# Patient Record
Sex: Female | Born: 1990 | Race: Black or African American | Hispanic: No | Marital: Married | Smoking: Never smoker
Health system: Southern US, Community
[De-identification: ages and names within clinical notes are randomized; demographics above are authoritative.]

## PROBLEM LIST (undated history)

## (undated) DIAGNOSIS — E282 Polycystic ovarian syndrome: Secondary | ICD-10-CM

## (undated) DIAGNOSIS — R519 Headache, unspecified: Secondary | ICD-10-CM

## (undated) HISTORY — PX: NO PAST SURGERIES: SHX2092

## (undated) HISTORY — DX: Polycystic ovarian syndrome: E28.2

---
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
## 2009-04-25 ENCOUNTER — Emergency Department (HOSPITAL_COMMUNITY): Admission: EM | Admit: 2009-04-25 | Discharge: 2009-04-25 | Payer: Self-pay | Admitting: Emergency Medicine

## 2011-01-07 ENCOUNTER — Ambulatory Visit: Payer: Self-pay | Admitting: Family Medicine

## 2011-04-18 DIAGNOSIS — E282 Polycystic ovarian syndrome: Secondary | ICD-10-CM

## 2011-04-18 HISTORY — DX: Polycystic ovarian syndrome: E28.2

## 2011-12-04 ENCOUNTER — Emergency Department (HOSPITAL_COMMUNITY)
Admission: EM | Admit: 2011-12-04 | Discharge: 2011-12-05 | Disposition: A | Discharge status: Home / Self Care | Payer: Self-pay | Attending: Emergency Medicine | Admitting: Emergency Medicine

## 2011-12-04 ENCOUNTER — Encounter (HOSPITAL_COMMUNITY): Payer: Self-pay | Admitting: *Deleted

## 2011-12-04 DIAGNOSIS — Z880 Allergy status to penicillin: Secondary | ICD-10-CM | POA: Insufficient documentation

## 2011-12-04 DIAGNOSIS — N939 Abnormal uterine and vaginal bleeding, unspecified: Secondary | ICD-10-CM | POA: Insufficient documentation

## 2011-12-04 DIAGNOSIS — N926 Irregular menstruation, unspecified: Secondary | ICD-10-CM | POA: Insufficient documentation

## 2011-12-04 LAB — URINE MICROSCOPIC-ADD ON

## 2011-12-04 LAB — URINALYSIS, ROUTINE W REFLEX MICROSCOPIC
Bilirubin Urine: NEGATIVE
Glucose, UA: NEGATIVE mg/dL
Ketones, ur: NEGATIVE mg/dL
Protein, ur: NEGATIVE mg/dL
Urobilinogen, UA: 1 mg/dL (ref 0.0–1.0)

## 2011-12-04 LAB — CBC WITH DIFFERENTIAL/PLATELET
Basophils Relative: 0 % (ref 0–1)
Eosinophils Absolute: 0.1 10*3/uL (ref 0.0–0.7)
Eosinophils Relative: 1 % (ref 0–5)
Hemoglobin: 10.5 g/dL — ABNORMAL LOW (ref 12.0–15.0)
Lymphs Abs: 3.9 10*3/uL (ref 0.7–4.0)
MCH: 27.8 pg (ref 26.0–34.0)
MCHC: 33.8 g/dL (ref 30.0–36.0)
MCV: 82.3 fL (ref 78.0–100.0)
Monocytes Absolute: 0.5 10*3/uL (ref 0.1–1.0)
Monocytes Relative: 6 % (ref 3–12)
RBC: 3.78 MIL/uL — ABNORMAL LOW (ref 3.87–5.11)

## 2011-12-04 LAB — PREGNANCY, URINE: Preg Test, Ur: NEGATIVE

## 2011-12-04 NOTE — ED Notes (Signed)
Pt has been having constant menstrual bleeding for past 4 wks; regular cycle two wks before that; passing large amt of clots and "white gray" material; feels weak as if "iron levels too low"; mild abd pain

## 2011-12-05 LAB — WET PREP, GENITAL: Yeast Wet Prep HPF POC: NONE SEEN

## 2011-12-05 NOTE — ED Provider Notes (Signed)
History     CSN: [id number]  Arrival date & time 12/04/11  2150   First MD Initiated Contact with Patient 12/05/11 0158      Chief Complaint  Patient presents with  . Vaginal Bleeding    (Consider location/radiation/quality/duration/timing/severity/associated sxs/prior treatment) HPI Comments: Patient comes in today with a chief complaint of vaginal bleeding.  She reports that she has had bleeding daily for the past 4 weeks.  Bleeding gradually improving.  She reports that she has been changing her tampon every hour, which she states has been saturated.  She states that she has never had this before.  She usually has regular 7 day menstrual cycles.  Her last normal menstrual period was 6 weeks ago.  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  She denies any fever or chills.  She denies dizziness or lightheadedness.  Denies shortness of breath.  No pelvic pain or abdominal pain.  No nausea or vomiting.  Denies vaginal discharge.  The history is provided by the patient.    History reviewed. No pertinent past medical history.  History reviewed. No pertinent past surgical history.  No family history on file.  History  Substance Use Topics  . Smoking status: Never Smoker   . Smokeless tobacco: Not on file  . Alcohol Use:     OB History    Grav Para Term Preterm Abortions TAB SAB Ect Mult Living                  Review of Systems  Constitutional: Negative for fever and chills.  HENT:       No epistaxis  Respiratory: Negative for shortness of breath.   Gastrointestinal: Negative for nausea, vomiting, abdominal pain and blood in stool.  Genitourinary: Positive for vaginal bleeding and menstrual problem. Negative for dysuria, frequency, vaginal discharge, pelvic pain and dyspareunia.  Neurological: Negative for dizziness, syncope and light-headedness.    Allergies  Penicillins  Home Medications   Current Outpatient Rx  Name Route Sig Dispense Refill  . BC HEADACHE PO Oral Take 1  packet by mouth daily as needed. Pain.      BP 115/67  Pulse 102  Temp 98 F (36.7 C) (Oral)  Resp 17  Wt 228 lb (103.42 kg)  SpO2 100%  LMP 12/04/2011  Physical Exam  Nursing note and vitals reviewed. Constitutional: She appears well-developed and well-nourished. No distress.  HENT:  Head: Normocephalic and atraumatic.  Cardiovascular: Normal rate, regular rhythm and normal heart sounds.   Pulmonary/Chest: Effort normal and breath sounds normal.  Abdominal: Soft. Bowel sounds are normal. She exhibits no distension and no mass. There is no tenderness. There is no rebound and no guarding.  Genitourinary: There is no rash, tenderness or lesion on the right labia. There is no rash, tenderness or lesion on the left labia. Cervix exhibits no motion tenderness. Right adnexum displays no mass, no tenderness and no fullness. Left adnexum displays no mass, no tenderness and no fullness. There is bleeding around the vagina. No tenderness around the vagina. No foreign body around the vagina.       Small amount of blood in the vaginal vault  Neurological: She is alert.  Skin: Skin is warm and dry. She is not diaphoretic.  Psychiatric: She has a normal mood and affect.    ED Course  Procedures (including critical care time)  Labs Reviewed  CBC WITH DIFFERENTIAL - Abnormal; Notable for the following:    RBC 3.78 (*)  Hemoglobin 10.5 (*)     HCT 31.1 (*)     All other components within normal limits  URINALYSIS, ROUTINE W REFLEX MICROSCOPIC - Abnormal; Notable for the following:    APPearance CLOUDY (*)     Hgb urine dipstick LARGE (*)     All other components within normal limits  URINE MICROSCOPIC-ADD ON - Abnormal; Notable for the following:    Bacteria, UA FEW (*)     All other components within normal limits  PREGNANCY, URINE   No results found.   No diagnosis found.    MDM  Patient with hemoglobin of 10.5.  VSS.   She is not having a large amount of vaginal bleeding at  this time.  Bleeding has been present for the past 4 weeks.  Therefore, feel that patient can be discharged home.  Patient given follow up with Gynecology.        Pascal Lux Lomita, PA-C 12/06/11 2220

## 2011-12-06 LAB — GC/CHLAMYDIA PROBE AMP, GENITAL
Chlamydia, DNA Probe: NEGATIVE
GC Probe Amp, Genital: NEGATIVE

## 2011-12-07 NOTE — ED Provider Notes (Signed)
Medical screening examination/treatment/procedure(s) were performed by non-physician practitioner and as supervising physician I was immediately available for consultation/collaboration.  Calder Oblinger M Larya Charpentier, MD 12/07/11 1622 

## 2012-02-16 ENCOUNTER — Emergency Department (HOSPITAL_COMMUNITY)
Admission: EM | Admit: 2012-02-16 | Discharge: 2012-02-16 | Disposition: A | Discharge status: Home / Self Care | Payer: Self-pay | Attending: Emergency Medicine | Admitting: Emergency Medicine

## 2012-02-16 ENCOUNTER — Encounter (HOSPITAL_COMMUNITY): Payer: Self-pay | Admitting: *Deleted

## 2012-02-16 DIAGNOSIS — Z79899 Other long term (current) drug therapy: Secondary | ICD-10-CM | POA: Insufficient documentation

## 2012-02-16 DIAGNOSIS — R197 Diarrhea, unspecified: Secondary | ICD-10-CM | POA: Insufficient documentation

## 2012-02-16 DIAGNOSIS — R1110 Vomiting, unspecified: Secondary | ICD-10-CM | POA: Insufficient documentation

## 2012-02-16 LAB — URINALYSIS, ROUTINE W REFLEX MICROSCOPIC
Glucose, UA: NEGATIVE mg/dL
Hgb urine dipstick: NEGATIVE
Ketones, ur: NEGATIVE mg/dL
Leukocytes, UA: NEGATIVE
pH: 8 (ref 5.0–8.0)

## 2012-02-16 LAB — CBC WITH DIFFERENTIAL/PLATELET
HCT: 36.5 % (ref 36.0–46.0)
Hemoglobin: 12.4 g/dL (ref 12.0–15.0)
Lymphs Abs: 2.7 10*3/uL (ref 0.7–4.0)
MCH: 26.9 pg (ref 26.0–34.0)
Monocytes Relative: 7 % (ref 3–12)
Neutro Abs: 3.9 10*3/uL (ref 1.7–7.7)
Neutrophils Relative %: 54 % (ref 43–77)
RBC: 4.61 MIL/uL (ref 3.87–5.11)

## 2012-02-16 LAB — COMPREHENSIVE METABOLIC PANEL
Alkaline Phosphatase: 35 U/L — ABNORMAL LOW (ref 39–117)
BUN: 7 mg/dL (ref 6–23)
Chloride: 105 mEq/L (ref 96–112)
GFR calc Af Amer: >90 mL/min (ref >90)
GFR calc non Af Amer: >90 mL/min (ref >90)
Glucose, Bld: 92 mg/dL (ref 70–99)
Potassium: 3.7 mEq/L (ref 3.5–5.1)
Total Bilirubin: 0.2 mg/dL — ABNORMAL LOW (ref 0.3–1.2)

## 2012-02-16 LAB — PREGNANCY, URINE: Preg Test, Ur: NEGATIVE

## 2012-02-16 MED ORDER — METOCLOPRAMIDE HCL 10 MG PO TABS: 10.0000 mg | ORAL_TABLET | Freq: Four times a day (QID) | ORAL | Status: DC | PRN

## 2012-02-16 MED ORDER — ONDANSETRON 8 MG PO TBDP: ORAL_TABLET | ORAL | Status: DC

## 2012-02-16 NOTE — ED Provider Notes (Signed)
History     CSN: [id number]  Arrival date & time 02/16/12  1708   First MD Initiated Contact with Patient 02/16/12 1943      Chief Complaint  Patient presents with  . Emesis    (Consider location/radiation/quality/duration/timing/severity/associated sxs/prior treatment) HPI This healthy 21 year old female had sudden onset of nonbloody vomiting and diarrhea multiple times with intermittent diffuse abdominal cramps this afternoon lasting several hours. She now is no pain anymore and has some minimal nausea. She feels much better now. She is no chest pain cough or shortness breath. She is no fever or body aches rash or altered mental status. There's no trauma. She is no dysuria or vaginal bleeding or vaginal discharge. Her abdominal pain was not localized. There is no treatment prior to arrival History reviewed. No pertinent past medical history.  History reviewed. No pertinent past surgical history.  No family history on file.  History  Substance Use Topics  . Smoking status: Never Smoker   . Smokeless tobacco: Not on file  . Alcohol Use: Yes    OB History    Grav Para Term Preterm Abortions TAB SAB Ect Mult Living                  Review of Systems 10 Systems reviewed and are negative for acute change except as noted in the HPI. Allergies  Penicillins  Home Medications   Current Outpatient Rx  Name  Route  Sig  Dispense  Refill  . BC HEADACHE PO   Oral   Take 1 packet by mouth daily as needed. Pain.         Marland Kitchen METOCLOPRAMIDE HCL 10 MG PO TABS   Oral   Take 1 tablet (10 mg total) by mouth every 6 (six) hours as needed (nausea/headache).   4 tablet   0   . ONDANSETRON 8 MG PO TBDP      8mg  ODT q4 hours prn nausea   3 tablet   0     BP 129/82  Pulse 82  Temp 98.5 F (36.9 C) (Oral)  Resp 20  SpO2 100%  LMP 12/17/2011  Physical Exam  Nursing note and vitals reviewed. Constitutional:       Awake, alert, nontoxic appearance.  HENT:  Head:  Atraumatic.  Eyes: Right eye exhibits no discharge. Left eye exhibits no discharge.  Neck: Neck supple.  Cardiovascular: Normal rate and regular rhythm.   No murmur heard. Pulmonary/Chest: Effort normal and breath sounds normal. No respiratory distress. She has no wheezes. She has no rales. She exhibits no tenderness.  Abdominal: Soft. Bowel sounds are normal. She exhibits no distension and no mass. There is no tenderness. There is no rebound and no guarding.  Musculoskeletal: She exhibits no tenderness.       Baseline ROM, no obvious new focal weakness.  Neurological: She is alert.       Mental status and motor strength appears baseline for patient and situation.  Skin: No rash noted.  Psychiatric: She has a normal mood and affect.    ED Course  Procedures (including critical care time)  Labs Reviewed  COMPREHENSIVE METABOLIC PANEL - Abnormal; Notable for the following:    Alkaline Phosphatase 35 (*)     Total Bilirubin 0.2 (*)     All other components within normal limits  URINALYSIS, ROUTINE W REFLEX MICROSCOPIC  PREGNANCY, URINE  CBC WITH DIFFERENTIAL  LAB REPORT - SCANNED   No results found.   1. Vomiting and diarrhea  MDM  Pt stable in ED with no significant deterioration in condition.  Patient / Family / Caregiver informed of clinical course, understand medical decision-making process, and agree with plan.  I doubt any other EMC precluding discharge at this time including, but not necessarily limited to the following:SBI.         Hurman Horn, MD 02/18/12 [phone]

## 2012-02-16 NOTE — ED Notes (Signed)
The pt ate a sandwich at 1200n today and 15-20 minutes after that she started having Kuna and diarrhea with abd cramps

## 2012-11-09 ENCOUNTER — Emergency Department: Payer: Self-pay | Admitting: Emergency Medicine

## 2012-11-09 LAB — CBC
HGB: 9.6 g/dL — ABNORMAL LOW (ref 12.0–16.0)
MCH: 21.7 pg — ABNORMAL LOW (ref 26.0–34.0)
MCHC: 30.8 g/dL — ABNORMAL LOW (ref 32.0–36.0)
RBC: 4.44 10*6/uL (ref 3.80–5.20)
WBC: 4.9 10*3/uL (ref 3.6–11.0)

## 2012-11-09 LAB — URINALYSIS, COMPLETE
Glucose,UR: NEGATIVE mg/dL (ref 0–75)
Leukocyte Esterase: NEGATIVE
Nitrite: NEGATIVE
Specific Gravity: 1.028 (ref 1.003–1.030)
WBC UR: 1 /HPF (ref 0–5)

## 2012-11-09 LAB — GC/CHLAMYDIA PROBE AMP

## 2012-11-09 LAB — COMPREHENSIVE METABOLIC PANEL
Albumin: 3.9 g/dL (ref 3.4–5.0)
BUN: 7 mg/dL (ref 7–18)
Calcium, Total: 9.4 mg/dL (ref 8.5–10.1)
Co2: 24 mmol/L (ref 21–32)
EGFR (Non-African Amer.): >60
Glucose: 86 mg/dL (ref 65–99)
Potassium: 3.4 mmol/L — ABNORMAL LOW (ref 3.5–5.1)
SGPT (ALT): 42 U/L (ref 12–78)

## 2014-11-03 ENCOUNTER — Ambulatory Visit
Admission: RE | Admit: 2014-11-03 | Discharge: 2014-11-03 | Disposition: A | Discharge status: Home / Self Care | Payer: PRIVATE HEALTH INSURANCE | Source: Ambulatory Visit | Attending: Infectious Diseases | Admitting: Infectious Diseases

## 2014-11-03 ENCOUNTER — Other Ambulatory Visit: Payer: Self-pay | Admitting: Infectious Diseases

## 2014-11-03 DIAGNOSIS — R7612 Nonspecific reaction to cell mediated immunity measurement of gamma interferon antigen response without active tuberculosis: Secondary | ICD-10-CM

## 2017-01-31 ENCOUNTER — Emergency Department
Admission: EM | Admit: 2017-01-31 | Discharge: 2017-01-31 | Disposition: A | Discharge status: Home / Self Care | Payer: Self-pay | Attending: Emergency Medicine | Admitting: Emergency Medicine

## 2017-01-31 ENCOUNTER — Emergency Department: Payer: Self-pay

## 2017-01-31 ENCOUNTER — Encounter: Payer: Self-pay | Admitting: Emergency Medicine

## 2017-01-31 DIAGNOSIS — R200 Anesthesia of skin: Secondary | ICD-10-CM | POA: Insufficient documentation

## 2017-01-31 DIAGNOSIS — R51 Headache: Secondary | ICD-10-CM | POA: Insufficient documentation

## 2017-01-31 LAB — DIFFERENTIAL
BASOS PCT: 1 %
Basophils Absolute: 0.1 10*3/uL (ref 0–0.1)
EOS PCT: 1 %
Eosinophils Absolute: 0.1 10*3/uL (ref 0–0.7)
LYMPHS PCT: 30 %
Lymphs Abs: 2.3 10*3/uL (ref 1.0–3.6)
MONO ABS: 0.5 10*3/uL (ref 0.2–0.9)
MONOS PCT: 7 %
NEUTROS ABS: 4.8 10*3/uL (ref 1.4–6.5)
Neutrophils Relative %: 61 %

## 2017-01-31 LAB — CBC
HEMATOCRIT: 41 % (ref 35.0–47.0)
Hemoglobin: 13.4 g/dL (ref 12.0–16.0)
MCH: 27.3 pg (ref 26.0–34.0)
MCHC: 32.8 g/dL (ref 32.0–36.0)
MCV: 83.3 fL (ref 80.0–100.0)
Platelets: 377 10*3/uL (ref 150–440)
RBC: 4.92 MIL/uL (ref 3.80–5.20)
RDW: 14 % (ref 11.5–14.5)
WBC: 7.8 10*3/uL (ref 3.6–11.0)

## 2017-01-31 LAB — COMPREHENSIVE METABOLIC PANEL
ALK PHOS: 31 U/L — AB (ref 38–126)
ALT: 16 U/L (ref 14–54)
ANION GAP: 9 (ref 5–15)
AST: 16 U/L (ref 15–41)
Albumin: 4.3 g/dL (ref 3.5–5.0)
BUN: 11 mg/dL (ref 6–20)
CALCIUM: 9.4 mg/dL (ref 8.9–10.3)
CO2: 24 mmol/L (ref 22–32)
CREATININE: 0.84 mg/dL (ref 0.44–1.00)
Chloride: 107 mmol/L (ref 101–111)
Glucose, Bld: 86 mg/dL (ref 65–99)
Potassium: 3.6 mmol/L (ref 3.5–5.1)
Sodium: 140 mmol/L (ref 135–145)
Total Bilirubin: 0.5 mg/dL (ref 0.3–1.2)
Total Protein: 8.1 g/dL (ref 6.5–8.1)

## 2017-01-31 LAB — TROPONIN I

## 2017-01-31 LAB — POCT PREGNANCY, URINE: PREG TEST UR: NEGATIVE

## 2017-01-31 MED ORDER — ASPIRIN 81 MG PO CHEW
CHEWABLE_TABLET | ORAL | Status: AC
  Administered 2017-01-31: 324 mg via ORAL
  Filled 2017-01-31: qty 4

## 2017-01-31 MED ORDER — SUMATRIPTAN SUCCINATE 50 MG PO TABS: 50.0000 mg | ORAL_TABLET | Freq: Once | ORAL | 0 refills | Status: DC | PRN

## 2017-01-31 MED ORDER — ASPIRIN 81 MG PO CHEW
324.0000 mg | CHEWABLE_TABLET | Freq: Once | ORAL | Status: AC
  Administered 2017-01-31: 324 mg via ORAL

## 2017-01-31 NOTE — ED Provider Notes (Signed)
[facility] Emergency Department Provider Note  ____________________________________________   First MD Initiated Contact with Patient 01/31/17 [phone]     (approximate)  I have reviewed the triage vital signs and the nursing notes.   HISTORY  Chief Complaint Headache and Numbness    HPI Victoria Kerr is a 26 y.o. female who self presents to the emergency department with gradual onset not maximal onset left sided throbbing headache similar to previous headaches as well as numbness in her left lateral arm and left pinky finger for roughly 24 hours. Her symptoms became more intense while at work today so she initially called 911 however declined transport when EMS arrived. She's had no fevers or chills. No neck pain. No photophobia. No nausea or vomiting. She also does note some numbness in her left lateral foot. No weakness. She has not fallen. Nothing in particular seems to make her symptoms better or worse. Her headache is nonradiating.   History reviewed. No pertinent past medical history.  There are no active problems to display for this patient.   History reviewed. No pertinent surgical history.  Prior to Admission medications   Medication Sig Start Date End Date Taking? Authorizing Provider  Aspirin-Salicylamide-Caffeine (BC HEADACHE PO) Take 1 packet by mouth daily as needed. Pain.    [provider]  metoCLOPramide (REGLAN) 10 MG tablet Take 1 tablet (10 mg total) by mouth every 6 (six) hours as needed (nausea/headache). 02/16/12   Wayland SalinasBednar, John, MD  ondansetron (ZOFRAN ODT) 8 MG disintegrating tablet 8mg  ODT q4 hours prn nausea 02/16/12   Wayland SalinasBednar, John, MD  SUMAtriptan (IMITREX) 50 MG tablet Take 1 tablet (50 mg total) by mouth once as needed for migraine. May repeat in 2 hours if headache persists or recurs. 01/31/17 02/01/18  Merrily Brittleifenbark, Travell Desaulniers, MD    Allergies Penicillins  No family history on file.  Social History Social  History  Substance Use Topics  . Smoking status: Never Smoker  . Smokeless tobacco: Never Used  . Alcohol use Yes    Review of Systems Constitutional: No fever/chills Eyes: No visual changes. ENT: No sore throat. Cardiovascular: Denies chest pain. Respiratory: Denies shortness of breath. Gastrointestinal: No abdominal pain.  No nausea, no vomiting.  No diarrhea.  No constipation. Genitourinary: Negative for dysuria. Musculoskeletal: Negative for back pain. Skin: Negative for rash. Neurological: positive for headache   ____________________________________________   PHYSICAL EXAM:  VITAL SIGNS: ED Triage Vitals  Enc Vitals Group     BP 01/31/17 1439 129/83     Pulse Rate 01/31/17 1439 100     Resp 01/31/17 1439 20     Temp 01/31/17 1439 98.4 F (36.9 C)     Temp Source 01/31/17 1439 Oral     SpO2 01/31/17 1439 99 %     Weight 01/31/17 1441 228 lb (103.4 kg)     Height --      Head Circumference --      Peak Flow --      Pain Score --      Pain Loc --      Pain Edu? --      Excl. in GC? --     Constitutional: alert and oriented 4 well appearing nontoxic no diaphoresis speaks full clear sentences Eyes: PERRL EOMI. Head: Atraumatic. Nose: No congestion/rhinnorhea. Mouth/Throat: No trismus Neck: No stridor.  no meningismus Cardiovascular: Normal rate, regular rhythm. Grossly normal heart sounds.  Good peripheral circulation. Respiratory: Normal respiratory effort.  No retractions. Lungs CTAB  and moving good air Gastrointestinal: soft nontender Musculoskeletal: No lower extremity edema   Neurologic:  Alert and oriented 4 Cranial nerves II through XII intact No pronator drift 5 out of 5 grips, biceps, triceps, hip flexion, hip extension plantar flexion, dorsiflexion subjective decreased sensation to left lateral arm and left foot 2+ DTRs and no ankle clonus Normal finger-nose-finger Ambulates with steady gait  Skin:  Skin is warm, dry and intact. No rash  noted. Psychiatric: Mood and affect are normal. Speech and behavior are normal.    ____________________________________________   DIFFERENTIAL includes but not limited to  stroke, TIA, complex migraine, dissection ____________________________________________   LABS (all labs ordered are listed, but only abnormal results are displayed)  Labs Reviewed  COMPREHENSIVE METABOLIC PANEL - Abnormal; Notable for the following:       Result Value   Alkaline Phosphatase 31 (*)    All other components within normal limits  CBC  DIFFERENTIAL  TROPONIN I  POCT PREGNANCY, URINE    blood work reviewed and interpreted by me as no acute disease __________________________________________  EKG  ED ECG REPORT I, Merrily Brittle, the attending physician, personally viewed and interpreted this ECG.  Date: 01/31/2017 EKG Time:  Rate: 90 Rhythm: normal sinus rhythm QRS Axis: normal Intervals: normal ST/T Wave abnormalities: normal Narrative Interpretation: no evidence of acute ischemia  ____________________________________________  RADIOLOGY  head CT reviewed by me shows no acute disease ____________________________________________   PROCEDURES  Procedure(s) performed: no  Procedures  Critical Care performed: no  Observation: no ____________________________________________   INITIAL IMPRESSION / ASSESSMENT AND PLAN / ED COURSE  Pertinent labs & imaging results that were available during my care of the patient were reviewed by me and considered in my medical decision making (see chart for details).  The patient arrives very well-appearing and neuro intact aside from numbness in her left lateral arm and some of her left foot. This very well could be radicular symptoms from her neck, however that would not explain her foot. She had a head CT in triage which is negative for acute disease and as her symptoms are greater than 24 hours out stroke should be positive at this point.  Distal possible that the patient has had a small stroke or transient ischemic attacks I've advised her to begin taking aspirin daily and to follow up with her primary care physician for possible TIA workup. She verbalizes understanding and agreement with the plan. She is discharged home in improved condition. Precautions given.      ____________________________________________   FINAL CLINICAL IMPRESSION(S) / ED DIAGNOSES  Final diagnoses:  Numbness      NEW MEDICATIONS STARTED DURING THIS VISIT:  Discharge Medication List as of 01/31/2017  4:21 PM    START taking these medications   Details  SUMAtriptan (IMITREX) 50 MG tablet Take 1 tablet (50 mg total) by mouth once as needed for migraine. May repeat in 2 hours if headache persists or recurs., Starting Wed 01/31/2017, Until Fri 02/01/2018, Print         Note:  This document was prepared using Dragon voice recognition software and may include unintentional dictation errors.     Merrily Brittle, MD 02/01/17 1445

## 2017-01-31 NOTE — Discharge Instructions (Signed)
Please begin taking 81 mg of aspirin every day and use your new migraine medication as needed for severe symptoms. Please make an appointment to establish care with a primary care physician within the next week for recheck. Return to the emergency department sooner for any new or worsening symptoms such as worsening numbness, weakness, seizure, or for any other concerns whatsoever.  It was a pleasure to take care of you today, and thank you for coming to our emergency department.  If you have any questions or concerns before leaving please ask the nurse to grab me and I'm more than happy to go through your aftercare instructions again.  If you were prescribed any opioid pain medication today such as Norco, Vicodin, Percocet, morphine, hydrocodone, or oxycodone please make sure you do not drive when you are taking this medication as it can alter your ability to drive safely.  If you have any concerns once you are home that you are not improving or are in fact getting worse before you can make it to your follow-up appointment, please do not hesitate to call 911 and come back for further evaluation.  Merrily BrittleNeil Ammiel Guiney, MD  Results for orders placed or performed during the hospital encounter of 01/31/17  CBC  Result Value Ref Range   WBC 7.8 3.6 - 11.0 K/uL   RBC 4.92 3.80 - 5.20 MIL/uL   Hemoglobin 13.4 12.0 - 16.0 g/dL   HCT 16.141.0 09.635.0 - 04.547.0 %   MCV 83.3 80.0 - 100.0 fL   MCH 27.3 26.0 - 34.0 pg   MCHC 32.8 32.0 - 36.0 g/dL   RDW 40.914.0 81.111.5 - 91.414.5 %   Platelets 377 150 - 440 K/uL  Differential  Result Value Ref Range   Neutrophils Relative % 61 %   Neutro Abs 4.8 1.4 - 6.5 K/uL   Lymphocytes Relative 30 %   Lymphs Abs 2.3 1.0 - 3.6 K/uL   Monocytes Relative 7 %   Monocytes Absolute 0.5 0.2 - 0.9 K/uL   Eosinophils Relative 1 %   Eosinophils Absolute 0.1 0 - 0.7 K/uL   Basophils Relative 1 %   Basophils Absolute 0.1 0 - 0.1 K/uL  Comprehensive metabolic panel  Result Value Ref Range   Sodium 140 135 - 145 mmol/L   Potassium 3.6 3.5 - 5.1 mmol/L   Chloride 107 101 - 111 mmol/L   CO2 24 22 - 32 mmol/L   Glucose, Bld 86 65 - 99 mg/dL   BUN 11 6 - 20 mg/dL   Creatinine, Ser 7.820.84 0.44 - 1.00 mg/dL   Calcium 9.4 8.9 - 95.610.3 mg/dL   Total Protein 8.1 6.5 - 8.1 g/dL   Albumin 4.3 3.5 - 5.0 g/dL   AST 16 15 - 41 U/L   ALT 16 14 - 54 U/L   Alkaline Phosphatase 31 (L) 38 - 126 U/L   Total Bilirubin 0.5 0.3 - 1.2 mg/dL   GFR calc non Af Amer >60 >60 mL/min   GFR calc Af Amer >60 >60 mL/min   Anion gap 9 5 - 15  Troponin I  Result Value Ref Range   Troponin I <0.03 <0.03 ng/mL  Pregnancy, urine POC  Result Value Ref Range   Preg Test, Ur NEGATIVE NEGATIVE   Ct Head Wo Contrast  Result Date: 01/31/2017 CLINICAL DATA:  Chronic headache. Left-sided arm and pinky finger numbness for 2 days with headache. EXAM: CT HEAD WITHOUT CONTRAST TECHNIQUE: Contiguous axial images were obtained from the base of  the skull through the vertex without intravenous contrast. COMPARISON:  None. FINDINGS: Brain: No evidence of acute infarction, hemorrhage, hydrocephalus, extra-axial collection or mass lesion/mass effect. Vascular: No hyperdense vessel or unexpected calcification. Skull: Normal. Negative for fracture or focal lesion. Sinuses/Orbits: No acute finding. IMPRESSION: Negative head CT. Electronically Signed   By: Marnee Spring M.D.   On: 01/31/2017 15:50

## 2017-01-31 NOTE — ED Triage Notes (Signed)
Pt c/o left sided arm and pinky finger numbness started two days with a headache, all resolved and the returned today. Pt with hx of migraines. Pt states her job called ems out today and they thought she may be having an anxiety attack. Pt denies any anxiety.

## 2017-01-31 NOTE — ED Notes (Signed)
Pt verbalizes d/c teaching and rx. Pt in NAD at time of d/c, pt ambulatory with spouse. Pt signed paper form of e-signature due to comput malfnx

## 2017-02-01 DIAGNOSIS — R7303 Prediabetes: Secondary | ICD-10-CM | POA: Insufficient documentation

## 2018-03-10 ENCOUNTER — Emergency Department: Payer: BLUE CROSS/BLUE SHIELD

## 2018-03-10 ENCOUNTER — Other Ambulatory Visit: Payer: Self-pay

## 2018-03-10 ENCOUNTER — Emergency Department
Admission: EM | Admit: 2018-03-10 | Discharge: 2018-03-10 | Disposition: A | Discharge status: Home / Self Care | Payer: BLUE CROSS/BLUE SHIELD | Attending: Emergency Medicine | Admitting: Emergency Medicine

## 2018-03-10 ENCOUNTER — Encounter: Payer: Self-pay | Admitting: Emergency Medicine

## 2018-03-10 DIAGNOSIS — K5900 Constipation, unspecified: Secondary | ICD-10-CM

## 2018-03-10 DIAGNOSIS — R1032 Left lower quadrant pain: Secondary | ICD-10-CM | POA: Diagnosis not present

## 2018-03-10 DIAGNOSIS — O9989 Other specified diseases and conditions complicating pregnancy, childbirth and the puerperium: Secondary | ICD-10-CM | POA: Insufficient documentation

## 2018-03-10 DIAGNOSIS — Z3A01 Less than 8 weeks gestation of pregnancy: Secondary | ICD-10-CM | POA: Diagnosis not present

## 2018-03-10 DIAGNOSIS — R109 Unspecified abdominal pain: Secondary | ICD-10-CM

## 2018-03-10 DIAGNOSIS — O99611 Diseases of the digestive system complicating pregnancy, first trimester: Secondary | ICD-10-CM | POA: Insufficient documentation

## 2018-03-10 DIAGNOSIS — O26891 Other specified pregnancy related conditions, first trimester: Secondary | ICD-10-CM

## 2018-03-10 LAB — CBC
HCT: 39.9 % (ref 36.0–46.0)
HEMOGLOBIN: 13.4 g/dL (ref 12.0–15.0)
MCH: 27.3 pg (ref 26.0–34.0)
MCHC: 33.6 g/dL (ref 30.0–36.0)
MCV: 81.3 fL (ref 80.0–100.0)
PLATELETS: 427 10*3/uL — AB (ref 150–400)
RBC: 4.91 MIL/uL (ref 3.87–5.11)
RDW: 13.3 % (ref 11.5–15.5)
WBC: 7.7 10*3/uL (ref 4.0–10.5)
nRBC: 0 % (ref 0.0–0.2)

## 2018-03-10 LAB — URINALYSIS, COMPLETE (UACMP) WITH MICROSCOPIC
Bacteria, UA: NONE SEEN
Bilirubin Urine: NEGATIVE
GLUCOSE, UA: NEGATIVE mg/dL
Ketones, ur: NEGATIVE mg/dL
Leukocytes, UA: NEGATIVE
NITRITE: NEGATIVE
PH: 6 (ref 5.0–8.0)
Protein, ur: NEGATIVE mg/dL
Specific Gravity, Urine: 1.011 (ref 1.005–1.030)
WBC UA: NONE SEEN WBC/hpf (ref 0–5)

## 2018-03-10 LAB — COMPREHENSIVE METABOLIC PANEL
ALK PHOS: 24 U/L — AB (ref 38–126)
ALT: 28 U/L (ref 0–44)
ANION GAP: 9 (ref 5–15)
AST: 18 U/L (ref 15–41)
Albumin: 4 g/dL (ref 3.5–5.0)
BUN: 6 mg/dL (ref 6–20)
CALCIUM: 9.5 mg/dL (ref 8.9–10.3)
CO2: 24 mmol/L (ref 22–32)
Chloride: 104 mmol/L (ref 98–111)
Creatinine, Ser: 0.65 mg/dL (ref 0.44–1.00)
GFR calc Af Amer: >60 mL/min (ref >60)
Glucose, Bld: 85 mg/dL (ref 70–99)
POTASSIUM: 4.1 mmol/L (ref 3.5–5.1)
SODIUM: 137 mmol/L (ref 135–145)
Total Bilirubin: 0.6 mg/dL (ref 0.3–1.2)
Total Protein: 7.4 g/dL (ref 6.5–8.1)

## 2018-03-10 LAB — LIPASE, BLOOD: LIPASE: 32 U/L (ref 11–51)

## 2018-03-10 LAB — HCG, QUANTITATIVE, PREGNANCY: HCG, BETA CHAIN, QUANT, S: 85833 m[IU]/mL — AB (ref <5)

## 2018-03-10 MED ORDER — DOCUSATE SODIUM 100 MG PO CAPS: 100.0000 mg | ORAL_CAPSULE | Freq: Every day | ORAL | 0 refills | Status: DC

## 2018-03-10 MED ORDER — SENNA 8.6 MG PO TABS: 1.0000 | ORAL_TABLET | Freq: Every day | ORAL | 0 refills | Status: DC

## 2018-03-10 NOTE — ED Notes (Signed)
Pt verbalized understanding of discharge instructions. NAD at this time. 

## 2018-03-10 NOTE — Discharge Instructions (Addendum)
Constipation: Take colace twice a day everyday. Take senna once a day at bedtime. Take daily probiotics. Drink plenty of fluids and eat a diet rich in fiber. If you go more than 3 days without a bowel movement, take 1 cap full of Miralax in the morning and one in the evening up to 5 days.  ° °

## 2018-03-10 NOTE — ED Notes (Addendum)
Patient transported to Ultrasound. Pt ambulatory and in NAD. Pt has been updated by this RN as best as possible to this point and verbalized understanding.

## 2018-03-10 NOTE — ED Triage Notes (Signed)
Pt in via POV with complaints of lower abdominal pain x approximately 3 weeks, states she is [redacted] weeks pregnant, unable to see OB until December.  Pt denies any abnormal bleeding, reports small amount brown discharge.  Reports nausea, denies vomiting/diarrhea.  Ambulatory to triage, NAD noted at this time.

## 2018-03-10 NOTE — ED Provider Notes (Signed)
[facility] Emergency Department Provider Note  ____________________________________________  Time seen: Approximately 4:30 PM  I have reviewed the triage vital signs and the nursing notes.   HISTORY  Chief Complaint Abdominal Pain   HPI Victoria Kerr is a 27 y.o. female no significant past medical history who presents for evaluation of left lower quadrant abdominal pain.  Patient reports the pain has been intermittent for 3 weeks.  The pain is sharp and usually associated with constipation.  She will take over-the-counter medications and have a bowel movement and the pain will resolve for several days.  Last week she took a pregnancy test and she was surprised to find that he was positive.  She then became concerned that the abdominal pain could be due to complication from the pregnancy.  This is patient's first pregnancy.  She denies any prior abdominal surgeries, fever or chills, vaginal discharge or vaginal bleeding, dysuria or hematuria, diarrhea, vomiting, or nausea.  She has an appointment with OB/GYN in 2 weeks but has not establish care for this pregnancy yet.  LMP was 01/19/2018.  PMH None - reviewed  Prior to Admission medications   Medication Sig Start Date End Date Taking? Authorizing Provider  Aspirin-Salicylamide-Caffeine (BC HEADACHE PO) Take 1 packet by mouth daily as needed. Pain.    [provider]  docusate sodium (COLACE) 100 MG capsule Take 1 capsule (100 mg total) by mouth daily. 03/10/18 03/10/19  Nita SickleVeronese, Bryant, MD  metoCLOPramide (REGLAN) 10 MG tablet Take 1 tablet (10 mg total) by mouth every 6 (six) hours as needed (nausea/headache). 02/16/12   Wayland SalinasBednar, John, MD  ondansetron (ZOFRAN ODT) 8 MG disintegrating tablet 8mg  ODT q4 hours prn nausea 02/16/12   Wayland SalinasBednar, John, MD  senna (SENOKOT) 8.6 MG TABS tablet Take 1 tablet (8.6 mg total) by mouth at bedtime. 03/10/18   Nita SickleVeronese, [place], MD  SUMAtriptan (IMITREX) 50 MG  tablet Take 1 tablet (50 mg total) by mouth once as needed for migraine. May repeat in 2 hours if headache persists or recurs. 01/31/17 02/01/18  Merrily Brittleifenbark, Neil, MD    Allergies Penicillins  No family history on file.  Social History Social History   Tobacco Use  . Smoking status: Never Smoker  . Smokeless tobacco: Never Used  Substance Use Topics  . Alcohol use: Yes  . Drug use: Never    Review of Systems  Constitutional: Negative for fever. Eyes: Negative for visual changes. ENT: Negative for sore throat. Neck: No neck pain  Cardiovascular: Negative for chest pain. Respiratory: Negative for shortness of breath. Gastrointestinal: + Left sided abdominal pain. No vomiting or diarrhea. Genitourinary: Negative for dysuria. Musculoskeletal: Negative for back pain. Skin: Negative for rash. Neurological: Negative for headaches, weakness or numbness. Psych: No SI or HI  ____________________________________________   PHYSICAL EXAM:  VITAL SIGNS: ED Triage Vitals [03/10/18 1316]  Enc Vitals Group     BP 130/89     Pulse Rate 82     Resp 16     Temp 98.4 F (36.9 C)     Temp Source Oral     SpO2 100 %     Weight 245 lb (111.1 kg)     Height 5\' 9"  (1.753 m)     Head Circumference      Peak Flow      Pain Score 0     Pain Loc      Pain Edu?      Excl. in GC?  Constitutional: Alert and oriented. Well appearing and in no apparent distress. HEENT:      Head: Normocephalic and atraumatic.         Eyes: Conjunctivae are normal. Sclera is non-icteric.       Mouth/Throat: Mucous membranes are moist.       Neck: Supple with no signs of meningismus. Cardiovascular: Regular rate and rhythm. No murmurs, gallops, or rubs. 2+ symmetrical distal pulses are present in all extremities. No JVD. Respiratory: Normal respiratory effort. Lungs are clear to auscultation bilaterally. No wheezes, crackles, or rhonchi.  Gastrointestinal: Soft, non tender, and non distended with  positive bowel sounds. No rebound or guarding. Musculoskeletal: Nontender with normal range of motion in all extremities. No edema, cyanosis, or erythema of extremities. Neurologic: Normal speech and language. Face is symmetric. Moving all extremities. No gross focal neurologic deficits are appreciated. Skin: Skin is warm, dry and intact. No rash noted. Psychiatric: Mood and affect are normal. Speech and behavior are normal.  ____________________________________________   LABS (all labs ordered are listed, but only abnormal results are displayed)  Labs Reviewed  COMPREHENSIVE METABOLIC PANEL - Abnormal; Notable for the following components:      Result Value   Alkaline Phosphatase 24 (*)    All other components within normal limits  CBC - Abnormal; Notable for the following components:   Platelets 427 (*)    All other components within normal limits  URINALYSIS, COMPLETE (UACMP) WITH MICROSCOPIC - Abnormal; Notable for the following components:   Color, Urine YELLOW (*)    APPearance CLEAR (*)    Hgb urine dipstick SMALL (*)    All other components within normal limits  HCG, QUANTITATIVE, PREGNANCY - Abnormal; Notable for the following components:   hCG, Beta Chain, Quant, S 16,109 (*)    All other components within normal limits  LIPASE, BLOOD   ____________________________________________  EKG  none  ____________________________________________  RADIOLOGY  I have personally reviewed the images performed during this visit and I agree with the Radiologist's read.   Interpretation by Radiologist:  US Ob Comp Less 14 Wks  Result Date: 03/10/2018 CLINICAL DATA:  Pregnant patient with abdominal pain. EXAM: OBSTETRIC <14 WK [place] AND TRANSVAGINAL OB US TECHNIQUE: Both transabdominal and transvaginal ultrasound examinations were performed for complete evaluation of the gestation as well as the maternal uterus, adnexal regions, and pelvic cul-de-sac. Transvaginal technique was  performed to assess early pregnancy. COMPARISON:  None. FINDINGS: Intrauterine gestational sac: Single Yolk sac:  Visualized. Embryo:  Visualized. Cardiac Activity: Visualized. Heart Rate: 123 bpm MSD:   mm    w     d CRL:  6.0 mm   6 w   3 d                  [place] [facility]: October 31, 2018 Subchorionic hemorrhage:  None visualized Maternal uterus/adnexae: Both ovaries are normal in appearance. Trace fluid in the cul-de-sac is likely physiologic. IMPRESSION: Single live IUP.  No acute abnormality. Electronically Signed   By: Gerome Sam III M.D   On: 03/10/2018 16:11   US Ob Transvaginal  Result Date: 03/10/2018 CLINICAL DATA:  Pregnant patient with abdominal pain. EXAM: OBSTETRIC <14 WK [place] AND TRANSVAGINAL OB US TECHNIQUE: Both transabdominal and transvaginal ultrasound examinations were performed for complete evaluation of the gestation as well as the maternal uterus, adnexal regions, and pelvic cul-de-sac. Transvaginal technique was performed to assess early pregnancy. COMPARISON:  None. FINDINGS: Intrauterine gestational sac: Single Yolk sac:  Visualized.  Embryo:  Visualized. Cardiac Activity: Visualized. Heart Rate: 123 bpm MSD:   mm    w     d CRL:  6.0 mm   6 w   3 d                  [place] [facility]: October 31, 2018 Subchorionic hemorrhage:  None visualized Maternal uterus/adnexae: Both ovaries are normal in appearance. Trace fluid in the cul-de-sac is likely physiologic. IMPRESSION: Single live IUP.  No acute abnormality. Electronically Signed   By: Gerome Sam III M.D   On: 03/10/2018 16:11     ____________________________________________   PROCEDURES  Procedure(s) performed: None Procedures Critical Care performed:  None ____________________________________________   INITIAL IMPRESSION / ASSESSMENT AND PLAN / ED COURSE  27 y.o. female no significant past medical history who presents for evaluation of intermittent left lower quadrant abdominal pain associated with constipation.  Patient was sent for  a transvaginal ultrasound which shows a single live IUP with a normal bilateral adnexa.  On physical exam patient is extremely well-appearing and in no distress, abdomen is soft with no tenderness at this time.  She has had no vaginal bleeding or vaginal discharge.  Recommended STD testing however patient has an appointment with OB/GYN next week and wishes to hold off so she can have that and a Pap smear done at the same time.  I explained to her that if her abdominal pain is due to an STD them increase her risk of a miscarriage the longer goes without being treated.  She denies any vaginal discharge or prior history of STDs, she understands the risks but wishes to wait.  She is requesting medication to help with her constipation which she has had on and off for a long time.  We will send patient home with senna and Colace, prenatal vitamins.  Discuss follow-up with OB/GYN in my standard return precautions.      As part of my medical decision making, I reviewed the following data within the electronic MEDICAL RECORD NUMBER Nursing notes reviewed and incorporated, Labs reviewed , Old chart reviewed, Radiograph reviewed , Notes from prior ED visits and [place] Controlled Substance Database    Pertinent labs & imaging results that were available during my care of the patient were reviewed by me and considered in my medical decision making (see chart for details).    ____________________________________________   FINAL CLINICAL IMPRESSION(S) / ED DIAGNOSES  Final diagnoses:  Abdominal pain during pregnancy in first trimester  Constipation, unspecified constipation type      NEW MEDICATIONS STARTED DURING THIS VISIT:  ED Discharge Orders         Ordered    docusate sodium (COLACE) 100 MG capsule  Daily     03/10/18 1638    senna (SENOKOT) 8.6 MG TABS tablet  Daily at bedtime     03/10/18 1638           Note:  This document was prepared using Dragon voice recognition software and may include  unintentional dictation errors.    Nita Sickle, MD 03/10/18 [phone]

## 2018-03-10 NOTE — ED Notes (Signed)
Signature pad not working. Hard copy printed and signed by pt for discharge.

## 2018-03-18 ENCOUNTER — Encounter: Payer: Self-pay | Admitting: Obstetrics and Gynecology

## 2018-03-18 ENCOUNTER — Ambulatory Visit: Payer: BLUE CROSS/BLUE SHIELD | Admitting: Obstetrics and Gynecology

## 2018-03-18 VITALS — BP 125/87 | HR 99 | Ht 69.0 in | Wt 246.0 lb

## 2018-03-18 DIAGNOSIS — Z3401 Encounter for supervision of normal first pregnancy, first trimester: Secondary | ICD-10-CM | POA: Diagnosis not present

## 2018-03-18 NOTE — Progress Notes (Signed)
Pt here today for pregnancy confirmation, LMP 01/18/18. Pt was recently in the ED for abdominal pain due to constipation where a blood test confirmed her pregnancy. Pt complains of nausea but is otherwise doing well.

## 2018-03-18 NOTE — Progress Notes (Signed)
HPI:      [name] is a 27 y.o. G1P0 who LMP was Patient's last menstrual period was 01/18/2018 (exact date).  Subjective:   She presents today for pregnancy confirmation visit.  She was initially seen in the emergency department for pelvic pain and constipation and was found to have a positive pregnancy test.  An ultrasound performed in the emergency department revealed a 6-week intrauterine pregnancy. Patient is experiencing some nausea and vomiting daily but is keeping liquids down.  Also keeping some meals down. She continues to experience some level of constipation. Patient describes a remote history of PCO, she states that she was able to cycle regularly by going on the keto diet and losing a large amount of weight which "fixed" her PCO. She is currently taking prenatal vitamins.    Hx: The following portions of the patient's history were reviewed and updated as appropriate:             She  has a past medical history of PCOS (polycystic ovarian syndrome) (2013). She does not have a problem list on file. She  has no past surgical history on file. Her family history includes Heart disease in her mother. She  reports that she has never smoked. She has never used smokeless tobacco. She reports that she drinks alcohol. She reports that she does not use drugs. She has a current medication list which includes the following prescription(s): docusate sodium, senna, aspirin-salicylamide-caffeine, metoclopramide, ondansetron, and sumatriptan. She is allergic to penicillins.       Review of Systems:  Review of Systems  Constitutional: Denied constitutional symptoms, night sweats, recent illness, fatigue, fever, insomnia and weight loss.  Eyes: Denied eye symptoms, eye pain, photophobia, vision change and visual disturbance.  Ears/Nose/Throat/Neck: Denied ear, nose, throat or neck symptoms, hearing loss, nasal discharge, sinus congestion and sore throat.  Cardiovascular:  Denied cardiovascular symptoms, arrhythmia, chest pain/pressure, edema, exercise intolerance, orthopnea and palpitations.  Respiratory: Denied pulmonary symptoms, asthma, pleuritic pain, productive sputum, cough, dyspnea and wheezing.  Gastrointestinal: Denied, gastro-esophageal reflux, melena, nausea and vomiting.  Genitourinary: Denied genitourinary symptoms including symptomatic vaginal discharge, pelvic relaxation issues, and urinary complaints.  Musculoskeletal: Denied musculoskeletal symptoms, stiffness, swelling, muscle weakness and myalgia.  Dermatologic: Denied dermatology symptoms, rash and scar.  Neurologic: Denied neurology symptoms, dizziness, headache, neck pain and syncope.  Psychiatric: Denied psychiatric symptoms, anxiety and depression.  Endocrine: Denied endocrine symptoms including hot flashes and night sweats.   Meds:   Current Outpatient Medications on File Prior to Visit  Medication Sig Dispense Refill  . docusate sodium (COLACE) 100 MG capsule Take 1 capsule (100 mg total) by mouth daily. 30 capsule 0  . senna (SENOKOT) 8.6 MG TABS tablet Take 1 tablet (8.6 mg total) by mouth at bedtime. 30 each 0  . Aspirin-Salicylamide-Caffeine (BC HEADACHE PO) Take 1 packet by mouth daily as needed. Pain.    . metoCLOPramide (REGLAN) 10 MG tablet Take 1 tablet (10 mg total) by mouth every 6 (six) hours as needed (nausea/headache). 4 tablet 0  . ondansetron (ZOFRAN ODT) 8 MG disintegrating tablet 8mg  ODT q4 hours prn nausea 3 tablet 0  . SUMAtriptan (IMITREX) 50 MG tablet Take 1 tablet (50 mg total) by mouth once as needed for migraine. May repeat in 2 hours if headache persists or recurs. 30 tablet 0   No current facility-administered medications on file prior to visit.     Objective:     Vitals:   03/18/18 0831  BP:  125/87  Pulse: 99              Ultrasound results reviewed directly with the patient.  Assessment:    G1P0 There are no active problems to display for  this patient.    1. Encounter for supervision of normal first pregnancy in first trimester     Patient with some nausea and vomiting but generally doing well during pregnancy.  Approximate 7 weeks.   Plan:            1.  Prenatal Plan 1.  The patient was given prenatal literature. 2.  She was continued on prenatal vitamins. 3.  A prenatal lab panel was ordered or drawn. 4.  An ultrasound was ordered to better determine an EDC. 5.  A nurse visit was scheduled. 6.  Genetic testing and testing for other inheritable conditions discussed in detail. She will decide in the future whether to have these labs performed. 7.  A general overview of pregnancy testing, visit schedule, ultrasound schedule, and prenatal care was discussed. 8.  We discussed use of fiber laxatives for constipation. 9.  Frequent small meals, brat diet, vitamin B6, ginger discussed for nausea. 10.  Recommend early 1 hour GCT.   Orders No orders of the defined types were placed in this encounter.   No orders of the defined types were placed in this encounter.     F/U  Return in about 5 weeks (around 04/22/2018). I spent 23 minutes involved in the care of this patient of which greater than 50% was spent discussing upcoming pregnancy, patient's history specifically PCO, genetic testing and spina bifida, nausea and vomiting during pregnancy-please see above for further discussions.  All questions answered.  Victoria Huskyavid J. , M.D. 03/18/2018 9:16 AM

## 2018-04-01 ENCOUNTER — Ambulatory Visit (INDEPENDENT_AMBULATORY_CARE_PROVIDER_SITE_OTHER): Payer: BLUE CROSS/BLUE SHIELD | Admitting: Obstetrics and Gynecology

## 2018-04-01 VITALS — BP 128/89 | HR 91 | Ht 69.0 in | Wt 250.1 lb

## 2018-04-01 DIAGNOSIS — Z3491 Encounter for supervision of normal pregnancy, unspecified, first trimester: Secondary | ICD-10-CM

## 2018-04-01 NOTE — Progress Notes (Signed)
Victoria Kerr presents for NOB nurse interview visit. Pregnancy confirmation done 03/10/18. G1. P0. Pregnancy education material explained and given. No cats in home. NOB labs ordered. TSH/HbgA1c ordered due to BMI 30 or greater. Sickle cell ordered due to patient's race. HIV labs and drug screen were explained and ordered. PNV encouraged. Genetic screening options discussed. Genetic testing: Unsure. Patient may discuss with the provider. Patient to follow up with provider on 04/23/18 for NOB physical. All questions answered.

## 2018-04-02 LAB — URINALYSIS, ROUTINE W REFLEX MICROSCOPIC
Bilirubin, UA: NEGATIVE
Glucose, UA: NEGATIVE
KETONES UA: NEGATIVE
LEUKOCYTES UA: NEGATIVE
NITRITE UA: NEGATIVE
PH UA: 7 (ref 5.0–7.5)
Protein, UA: NEGATIVE
RBC, UA: NEGATIVE
Specific Gravity, UA: 1.015 (ref 1.005–1.030)
Urobilinogen, Ur: 0.2 mg/dL (ref 0.2–1.0)

## 2018-04-02 LAB — MONITOR DRUG PROFILE 14(MW)
Amphetamine Scrn, Ur: NEGATIVE ng/mL
BARBITURATE SCREEN URINE: NEGATIVE ng/mL
BENZODIAZEPINE SCREEN, URINE: NEGATIVE ng/mL
BUPRENORPHINE, URINE: NEGATIVE ng/mL
CANNABINOIDS UR QL SCN: NEGATIVE ng/mL
COCAINE(METAB.)SCREEN, URINE: NEGATIVE ng/mL
Creatinine(Crt), U: 130.6 mg/dL (ref 20.0–300.0)
Fentanyl, Urine: NEGATIVE pg/mL
MEPERIDINE SCREEN, URINE: NEGATIVE ng/mL
Methadone Screen, Urine: NEGATIVE ng/mL
OXYCODONE+OXYMORPHONE UR QL SCN: NEGATIVE ng/mL
Opiate Scrn, Ur: NEGATIVE ng/mL
PHENCYCLIDINE QUANTITATIVE URINE: NEGATIVE ng/mL
PROPOXYPHENE SCREEN URINE: NEGATIVE ng/mL
Ph of Urine: 6.8 (ref 4.5–8.9)
SPECIFIC GRAVITY: 1.014
Tramadol Screen, Urine: NEGATIVE ng/mL

## 2018-04-02 LAB — NICOTINE SCREEN, URINE: Cotinine Ql Scrn, Ur: NEGATIVE ng/mL

## 2018-04-03 LAB — GC/CHLAMYDIA PROBE AMP
Chlamydia trachomatis, NAA: NEGATIVE
Neisseria gonorrhoeae by PCR: NEGATIVE

## 2018-04-03 LAB — URINE CULTURE, OB REFLEX: Organism ID, Bacteria: NO GROWTH

## 2018-04-03 LAB — CULTURE, OB URINE

## 2018-04-05 LAB — HGB SOLU + RFLX FRAC: SICKLE SOLUBILITY TEST - HGBRFX: POSITIVE — AB

## 2018-04-05 LAB — HEMOGLOBIN FRAC.W/O SOLUBILITY
HGB C: 0 %
HGB S: 33.1 % — AB
HGB VARIANT: 0 %
Hemoglobin A2 Quantitation: 3.9 % — ABNORMAL HIGH (ref 1.8–3.2)
Hemoglobin F Quantitation: 0 % (ref 0.0–2.0)
Hgb A: 63 % — ABNORMAL LOW (ref 96.4–98.8)

## 2018-04-05 LAB — BETA HCG QUANT (REF LAB): hCG Quant: 120714 m[IU]/mL

## 2018-04-05 LAB — TSH: TSH: 0.932 u[IU]/mL (ref 0.450–4.500)

## 2018-04-05 LAB — HEMOGLOBIN A1C
Est. average glucose Bld gHb Est-mCnc: 123 mg/dL
Hgb A1c MFr Bld: 5.9 % — ABNORMAL HIGH (ref 4.8–5.6)

## 2018-04-05 LAB — NICOTINE/COTININE METABOLITES
COTININE: NOT DETECTED ng/mL
NICOTINE: NOT DETECTED ng/mL

## 2018-04-05 LAB — ABO AND RH: Rh Factor: POSITIVE

## 2018-04-05 LAB — HEPATITIS B SURFACE ANTIGEN: Hepatitis B Surface Ag: NEGATIVE

## 2018-04-05 LAB — HIV ANTIBODY (ROUTINE TESTING W REFLEX): HIV SCREEN 4TH GENERATION: NONREACTIVE

## 2018-04-05 LAB — VARICELLA ZOSTER ANTIBODY, IGG: Varicella zoster IgG: 1213 index (ref >165)

## 2018-04-05 LAB — ANTIBODY SCREEN: Antibody Screen: NEGATIVE

## 2018-04-05 LAB — RPR: RPR Ser Ql: NONREACTIVE

## 2018-04-05 LAB — RUBELLA SCREEN: Rubella Antibodies, IGG: 22.3 index (ref >0.99)

## 2018-04-23 ENCOUNTER — Other Ambulatory Visit (HOSPITAL_COMMUNITY)
Admission: RE | Admit: 2018-04-23 | Discharge: 2018-04-23 | Disposition: A | Discharge status: Home / Self Care | Payer: BLUE CROSS/BLUE SHIELD | Source: Ambulatory Visit | Attending: Obstetrics and Gynecology | Admitting: Obstetrics and Gynecology

## 2018-04-23 ENCOUNTER — Encounter: Payer: Self-pay | Admitting: Obstetrics and Gynecology

## 2018-04-23 ENCOUNTER — Ambulatory Visit (INDEPENDENT_AMBULATORY_CARE_PROVIDER_SITE_OTHER): Payer: BLUE CROSS/BLUE SHIELD | Admitting: Obstetrics and Gynecology

## 2018-04-23 VITALS — BP 123/86 | HR 98 | Wt 252.4 lb

## 2018-04-23 DIAGNOSIS — Z124 Encounter for screening for malignant neoplasm of cervix: Secondary | ICD-10-CM | POA: Insufficient documentation

## 2018-04-23 DIAGNOSIS — Z3A12 12 weeks gestation of pregnancy: Secondary | ICD-10-CM

## 2018-04-23 DIAGNOSIS — Z3401 Encounter for supervision of normal first pregnancy, first trimester: Secondary | ICD-10-CM

## 2018-04-23 NOTE — Progress Notes (Signed)
Patient comes I today for new OB visit. Patient has had some nausea and vomiting. No other problems or concerns today. Patient is due for her Pap today. She would like to do genetic testing if insurance will cover.

## 2018-04-23 NOTE — Progress Notes (Signed)
NOB: Patient here for new OB physical.  Taking vitamins as directed.  Nausea and vomiting resolving.  She has no complaints.  She does desire genetic testing today.  1 hour GCT with next visit AFP at next visit.  Physical examination General NAD, Conversant  HEENT Atraumatic; Op clear with mmm.  Normo-cephalic. Pupils reactive. Anicteric sclerae  Thyroid/Neck Smooth without nodularity or enlargement. Normal ROM.  Neck Supple.  Skin No rashes, lesions or ulceration. Normal palpated skin turgor. No nodularity.  Breasts: No masses or discharge.  Symmetric.  No axillary adenopathy.  Lungs: Clear to auscultation.No rales or wheezes. Normal Respiratory effort, no retractions.  Heart: NSR.  No murmurs or rubs appreciated. No periferal edema  Abdomen: Soft.  Non-tender.  No masses.  No HSM. No hernia  Extremities: Moves all appropriately.  Normal ROM for age. No lymphadenopathy.  Neuro: Oriented to PPT.  Normal mood. Normal affect.     Pelvic:   Vulva: Normal appearance.  No lesions.  Vagina: No lesions or abnormalities noted.  Support: Normal pelvic support.  Urethra No masses tenderness or scarring.  Meatus Normal size without lesions or prolapse.  Cervix: Normal appearance.  No lesions.  Anus: Normal exam.  No lesions.  Perineum: Normal exam.  No lesions.        Bimanual   Adnexae: No masses.  Non-tender to palpation.  Uterus: Enlarged. 12 wks  Pos FHT's  Non-tender.  Mobile.  AV.  Adnexae: No masses.  Non-tender to palpation.  Cul-de-sac: Negative for abnormality.  Adnexae: No masses.  Non-tender to palpation.         Pelvimetry   Diagonal: Reached.  Spines: Average.  Sacrum: Concave.  Pubic Arch: Normal.   Pap performed GC/CT.

## 2018-04-24 LAB — CYTOLOGY - PAP
CHLAMYDIA, DNA PROBE: NEGATIVE
Diagnosis: NEGATIVE
NEISSERIA GONORRHEA: NEGATIVE

## 2018-04-28 LAB — MATERNIT21  PLUS CORE+ESS+SCA, BLOOD
Chromosome 13: NEGATIVE
Chromosome 18: NEGATIVE
Chromosome 21: NEGATIVE
Fetal Fraction: 4
Y Chromosome: NOT DETECTED

## 2018-05-21 ENCOUNTER — Ambulatory Visit (INDEPENDENT_AMBULATORY_CARE_PROVIDER_SITE_OTHER): Payer: BLUE CROSS/BLUE SHIELD | Admitting: Obstetrics and Gynecology

## 2018-05-21 ENCOUNTER — Other Ambulatory Visit: Payer: BLUE CROSS/BLUE SHIELD

## 2018-05-21 ENCOUNTER — Encounter: Payer: Self-pay | Admitting: Obstetrics and Gynecology

## 2018-05-21 VITALS — BP 130/87 | HR 96 | Wt 252.4 lb

## 2018-05-21 DIAGNOSIS — O9902 Anemia complicating childbirth: Secondary | ICD-10-CM

## 2018-05-21 DIAGNOSIS — O99012 Anemia complicating pregnancy, second trimester: Secondary | ICD-10-CM

## 2018-05-21 DIAGNOSIS — Z3402 Encounter for supervision of normal first pregnancy, second trimester: Secondary | ICD-10-CM

## 2018-05-21 DIAGNOSIS — O99210 Obesity complicating pregnancy, unspecified trimester: Secondary | ICD-10-CM

## 2018-05-21 DIAGNOSIS — O99212 Obesity complicating pregnancy, second trimester: Secondary | ICD-10-CM

## 2018-05-21 DIAGNOSIS — D573 Sickle-cell trait: Secondary | ICD-10-CM | POA: Insufficient documentation

## 2018-05-21 LAB — POCT URINALYSIS DIPSTICK OB
Bilirubin, UA: NEGATIVE
Glucose, UA: NEGATIVE
Ketones, UA: NEGATIVE
Leukocytes, UA: NEGATIVE
Nitrite, UA: NEGATIVE
POC,PROTEIN,UA: NEGATIVE
Spec Grav, UA: 1.02 (ref 1.010–1.025)
Urobilinogen, UA: 0.2 E.U./dL
pH, UA: 6 (ref 5.0–8.0)

## 2018-05-21 MED ORDER — HYDROCORTISONE 0.5 % EX CREA: 1.0000 "application " | TOPICAL_CREAM | Freq: Two times a day (BID) | CUTANEOUS | 0 refills | Status: DC

## 2018-05-21 NOTE — Progress Notes (Signed)
ROB-Pt stated that she is doing well no concerns.

## 2018-05-21 NOTE — Progress Notes (Signed)
ROB: Doing well. Does note flare of eczema, recommend Cetaphil OTC, and can prescribe HCTZ cream as needed.  Declines flu vaccine. Discussed sickle cell trait, need for partner testing. Is completing early glucola today. RTC in 4 weeks, for anatomy scan.

## 2018-05-22 LAB — GLUCOSE, 1 HOUR GESTATIONAL: Gestational Diabetes Screen: 173 mg/dL — ABNORMAL HIGH (ref 65–139)

## 2018-05-24 ENCOUNTER — Other Ambulatory Visit: Payer: Self-pay

## 2018-05-24 DIAGNOSIS — Z3402 Encounter for supervision of normal first pregnancy, second trimester: Secondary | ICD-10-CM

## 2018-06-06 ENCOUNTER — Emergency Department: Payer: BLUE CROSS/BLUE SHIELD

## 2018-06-06 ENCOUNTER — Emergency Department
Admission: EM | Admit: 2018-06-06 | Discharge: 2018-06-07 | Disposition: A | Discharge status: Home / Self Care | Payer: BLUE CROSS/BLUE SHIELD | Attending: Emergency Medicine | Admitting: Emergency Medicine

## 2018-06-06 ENCOUNTER — Encounter: Payer: Self-pay | Admitting: *Deleted

## 2018-06-06 ENCOUNTER — Other Ambulatory Visit: Payer: Self-pay

## 2018-06-06 DIAGNOSIS — O42912 Preterm premature rupture of membranes, unspecified as to length of time between rupture and onset of labor, second trimester: Secondary | ICD-10-CM | POA: Diagnosis present

## 2018-06-06 DIAGNOSIS — Z3490 Encounter for supervision of normal pregnancy, unspecified, unspecified trimester: Secondary | ICD-10-CM

## 2018-06-06 DIAGNOSIS — Z3A19 19 weeks gestation of pregnancy: Secondary | ICD-10-CM | POA: Diagnosis not present

## 2018-06-06 LAB — CBC
HCT: 38.3 % (ref 36.0–46.0)
Hemoglobin: 12.9 g/dL (ref 12.0–15.0)
MCH: 26.8 pg (ref 26.0–34.0)
MCHC: 33.7 g/dL (ref 30.0–36.0)
MCV: 79.6 fL — ABNORMAL LOW (ref 80.0–100.0)
Platelets: 370 10*3/uL (ref 150–400)
RBC: 4.81 MIL/uL (ref 3.87–5.11)
RDW: 13.2 % (ref 11.5–15.5)
WBC: 10.9 10*3/uL — ABNORMAL HIGH (ref 4.0–10.5)
nRBC: 0 % (ref 0.0–0.2)

## 2018-06-06 LAB — ABO/RH: ABO/RH(D): A POS

## 2018-06-06 LAB — CHLAMYDIA/NGC RT PCR (ARMC ONLY)
Chlamydia Tr: NOT DETECTED
N GONORRHOEAE: NOT DETECTED

## 2018-06-06 LAB — HCG, QUANTITATIVE, PREGNANCY: hCG, Beta Chain, Quant, S: 12440 m[IU]/mL — ABNORMAL HIGH (ref <5)

## 2018-06-06 MED ORDER — SODIUM CHLORIDE 0.9 % IV SOLN
250.0000 mg | Freq: Once | INTRAVENOUS | Status: AC
  Administered 2018-06-07: 250 mg via INTRAVENOUS
  Filled 2018-06-06: qty 5

## 2018-06-06 MED ORDER — SODIUM CHLORIDE 0.9 % IV SOLN
500.0000 mg | Freq: Once | INTRAVENOUS | Status: AC
  Administered 2018-06-06: 500 mg via INTRAVENOUS
  Filled 2018-06-06: qty 500

## 2018-06-06 MED ORDER — ERYTHROMYCIN BASE 500 MG PO TABS: 500.0000 mg | ORAL_TABLET | Freq: Four times a day (QID) | ORAL | 0 refills | Status: AC

## 2018-06-06 MED ORDER — AZITHROMYCIN 500 MG PO TABS: 500.0000 mg | ORAL_TABLET | Freq: Every day | ORAL | 0 refills | Status: DC

## 2018-06-06 NOTE — ED Notes (Signed)
Pharmacy notified to send IVPG Erythromycin; per pharmacy tech, this drug is slow to dilute.  Will give medication when available.

## 2018-06-06 NOTE — Consult Note (Signed)
Reason for Consult: Preterm Rupture of Membranes Referring Physician: Dr. Dorothea GlassmanPaul Malinda (ER Physician)  Dillard Cannonemple Briggs Ileene Patrickinchback is an 28 y.o. G1P0 female  at 2783w0d (by first trimester sono) with Estimated Date of Delivery: 10/31/18 who presented to th Emergency Room with complaints of leaking of fluids after having a bowel movement earlier today. She denies vaginal bleeding, contractions or cramping, and noted occasional fetal movements (although more decreased today).  She did endorse some nausea earlier today, however denied vomiting. She reports fetal heart tones at home (has purchased a Doppler at home for use).   Pertinent Gynecological History: Last pap: normal Date: 04/23/2018.  Menses: regularly occurring monthly, moderate flow.  Patient's last menstrual period was 01/18/2018 (exact date).   OB History  Gravida Para Term Preterm AB Living  1            SAB TAB Ectopic Multiple Live Births               # Outcome Date GA Lbr Len/2nd Weight Sex Delivery Anes PTL Lv  1 Current                Past Medical History:  Diagnosis Date  . PCOS (polycystic ovarian syndrome) 2013    History reviewed. No pertinent surgical history.   Family History  Problem Relation Age of Onset  . Heart disease Mother     Social History:  reports that she has never smoked. She has never used smokeless tobacco. She reports current alcohol use. She reports that she does not use drugs.  Allergies:  Allergies  Allergen Reactions  . Penicillins     Unknown childhood reaction.     Medications: I have reviewed the patient's current medications. Prior to Admission: (Not in a hospital admission)   Review of Systems  Constitutional: Negative for chills, fever and weight loss.  HENT: Negative.   Eyes: Negative.   Respiratory: Negative.   Cardiovascular: Negative.   Gastrointestinal: Negative.   Genitourinary: Negative for dysuria, flank pain, hematuria and urgency.       Positive for possible  leaking of fluid from vagina  Musculoskeletal: Negative.   Skin: Negative for itching and rash.  Neurological: Negative.   Endo/Heme/Allergies: Negative.   Psychiatric/Behavioral: Negative.     Blood pressure (!) 137/91, pulse (!) 103, temperature 98.3 F (36.8 C), temperature source Oral, resp. rate 18, height 5\' 9"  (1.753 m), weight 114.3 kg, last menstrual period 01/18/2018, SpO2 99 %.   Physical Exam  Constitutional: She is oriented to person, place, and time. She appears well-developed and well-nourished. No distress.  HENT:  Head: Normocephalic and atraumatic.  Eyes: Pupils are equal, round, and reactive to light. Conjunctivae and EOM are normal. Right eye exhibits no discharge. Left eye exhibits no discharge. No scleral icterus.  Neck: Normal range of motion. Neck supple. No JVD present. No thyromegaly present.  Cardiovascular: Normal rate, regular rhythm and normal heart sounds. Exam reveals no gallop and no friction rub.  No murmur heard. Respiratory: Effort normal and breath sounds normal. No respiratory distress.  GI: Soft. Bowel sounds are normal. She exhibits no distension. There is no abdominal tenderness. There is no guarding.  Gravid uterus  Genitourinary:    Vagina normal.     Genitourinary Comments: Speculum exam with small amount of watery brownish discharge. Cervix appears normal, possibly FT dilation by visualization, no lesions or motion tenderness. Digital exam not performed.    Musculoskeletal: Normal range of motion.  General: No tenderness or edema.  Neurological: She is alert and oriented to person, place, and time.  Skin: Skin is warm and dry. No rash noted.  Psychiatric: She has a normal mood and affect. Her behavior is normal.    Results for orders placed or performed during the hospital encounter of 06/06/18 (from the past 48 hour(s))  ABO/Rh     Status: None   Collection Time: 06/06/18  6:46 PM  Result Value Ref Range   ABO/RH(D)      A  POS Performed at [facility], [address] [place], [place] [address]   hCG, quantitative, pregnancy     Status: Abnormal   Collection Time: 06/06/18  6:46 PM  Result Value Ref Range   hCG, Beta Chain, Quant, S 12,440 (H) <5 mIU/mL    Comment:          GEST. AGE      CONC.  (mIU/mL)   <=1 WEEK        5 - 50     2 WEEKS       50 - 500     3 WEEKS       100 - 10,000     4 WEEKS     1,000 - 30,000     5 WEEKS     3,500 - 115,000   6-8 WEEKS     12,000 - 270,000    12 WEEKS     15,000 - 220,000        FEMALE AND NON-PREGNANT FEMALE:     LESS THAN 5 mIU/mL Performed at [facility], [address] [place], [place] [address]     US Ob Limited  Result Date: 06/06/2018 CLINICAL DATA:  Abdominal discomfort for 3 weeks EXAM: LIMITED OBSTETRIC ULTRASOUND FINDINGS: Number of Fetuses: 1 Heart Rate:  158 bpm Movement: Present per sonographer Presentation: Cephalic Placental Location: Posterior Previa: None Amniotic Fluid (Subjective): Decreased with no significant fluid visualized. AFI: No measurable fluid pockets. FL: 2.8 cm 18 w  4 d MATERNAL FINDINGS: Cervix:  Poorly visualized Uterus/Adnexae: Ovaries are not seen. IMPRESSION: Single intrauterine pregnancy with fetal cardiac activity of 158 BPM. Abnormal decreased quantity of amniotic fluid with no measurable fluid pockets. Cervix is poorly evaluated. This exam is performed on an emergent basis and does not comprehensively evaluate fetal size, dating, or anatomy; follow-up complete OB US should be considered if further fetal assessment is warranted. Electronically Signed   By: Jasmine Pang M.D.   On: 06/06/2018 20:12    Assessment: - Preterm (previable) premature rupture of membranes in first pregnancy  Plan:  Ultrasound reveals oligo/anhydramnios, confirming suspicions for ruptured membranes at previable gestational age. Lengthy discussion had with patient regarding expectant management vs termination of pregnancy.  Shared most recent data regarding current diagnosis from UpToDate:  Discussed that PROM before or at the limit of viability complicates 0.1 to 0.7 percent of pregnancies.  It can be attributed to a variety of factors, including prior history of preterm birth, tobacco abuse, vaginal/cervical infection, cervical insufficiency, uterine anomalies, poor overall maternal health, etc. Advised that up to 14 percent of gravidas with spontaneous PROM before or at the limit of viability eventually stop leaking amniotic fluid, presumably due to resealing of the fetal membranes.  A second subset of patients continue to leak fluid, but have partial reaccumulation of amniotic fluid. Approximately 25 percent of patients with PROM before or at the limit of viability reaccumulate fluid on ultrasound examination during  expectant management. Informed that the weighted mean latency from gestational age at PROM before or at the limit of viability to delivery was 11 days, and the weighted median latency was 6.8 days. Informed of risks of continuing the pregnancy, including chorioamnionitis, cord prolapse, placental abruption, fetal death, pulmonary hypoplasia, fetal skeletal malformations. Discussed management of the pregnancy with latency antibiotics for 7-10 days, weekly follow up with ultrasounds to reassess fetal status and fluid accumulation, antenatal steroids at approximately 22-[redacted] weeks gestation, referral to Maternal Fetal Medicine for consultation, and likely delivery at 24 weeks at a tertiary center once viability reached. She will also need to be on light duty/modified bedrest until age of viability reached.   Also discussed risks and benefits of termination of pregnancy, including induction of labor or surgical management,  decrease in risk of infection from prolonged rupture.   All questions answered to patient and husband's satisfaction. After discussion with patient, she has opted for expectant management.  Will  initiate on antibiotics with Erythromycin (or Azithromycin if antibiotic not available), and Keflex (as patient has PCN allergy).  Will receive initial dose IV, and then begin oral dosing. GBS, GC/CT, and UA performed to rule out infectious cause.  Can discharge home.  Patient will f/u in office on Monday.    A total of 60 minutes were spent face-to-face with the patient during the encounter with greater than 50% dealing with counseling and coordination of care.   Hildred Laser, MD [facility] 06/06/2018

## 2018-06-06 NOTE — ED Notes (Signed)
Dr. Valentino Saxon, OB to bedside at this time.

## 2018-06-06 NOTE — ED Notes (Signed)
Pt states she is unable to provide urine specimen at this time.

## 2018-06-06 NOTE — ED Triage Notes (Signed)
Pt to ED reporting she is [redacted] weeks pregnant and while having a BM this morning she felt her water break. Pt reports she has a doppler at home and heard the babies HB today. She has not been able to feel much fetal movement to this point in the pregnancy but especially not today. No bleeding at this time. No complications in pregnancy up to this point.

## 2018-06-06 NOTE — ED Provider Notes (Signed)
[facility] Emergency Department Provider Note  ____________________________________________  Time seen: Approximately 10:38 PM  I have reviewed the triage vital signs and the nursing notes.   HISTORY  Chief Complaint water broke - [redacted] weeks pregnant    HPI Victoria Kerr is a 28 y.o. female G1, P0 approximately [redacted] weeks pregnant presenting for gush of fluid.  The patient reports that she was sitting on the toilet when she felt a gush of fluid and was concerned that her water broke.  The patient denies any pain or pelvic/abdominal cramping.  The patient reports that she has not felt true fetal movement yet, but feels "fluttering," which persists.  Upon arrival, the patient went immediately to ultrasonography which did show a decreased quantity of amniotic fluid without any measurable fluid pockets, with a single IUP with cardiac activity of 158.  Dr. Valentino Saxon was called immediately for evaluation.  Past Medical History:  Diagnosis Date  . PCOS (polycystic ovarian syndrome) 2013    Patient Active Problem List   Diagnosis Date Noted  . 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 No pertinent surgical history.  Current Outpatient Rx  . Order #: [id number] Class: Normal  . Order #: [id number] Class: Historical Med  . Order #: [id number] Class: Historical Med  . Order #: [id number] Class: Normal  . Order #: [id number] Class: Normal    Allergies Penicillins  Family History  Problem Relation Age of Onset  . Heart disease Mother     Social History Social History   Tobacco Use  . Smoking status: Never Smoker  . Smokeless tobacco: Never Used  Substance Use Topics  . Alcohol use: Yes  . Drug use: Never    Review of Systems Constitutional: No fever/chills.  No lightheadedness or syncope. Eyes: No visual changes. ENT: No sore throat. No congestion or rhinorrhea. Cardiovascular: Denies chest pain. Denies  palpitations. Respiratory: Denies shortness of breath.  No cough. Gastrointestinal: No abdominal pain.  No nausea, no vomiting.  No diarrhea.  No constipation. Genitourinary: Negative for dysuria.  Positive gush of fluid. Musculoskeletal: Negative for back pain. Skin: Negative for rash. Neurological: Negative for headaches. No focal numbness, tingling or weakness.     ____________________________________________   PHYSICAL EXAM:  VITAL SIGNS: ED Triage Vitals  Enc Vitals Group     BP 06/06/18 1830 (!) 137/91     Pulse Rate 06/06/18 1830 (!) 103     Resp 06/06/18 1830 18     Temp 06/06/18 1830 98.3 F (36.8 C)     Temp Source 06/06/18 1830 Oral     SpO2 06/06/18 1830 99 %     Weight 06/06/18 1830 252 lb (114.3 kg)     Height 06/06/18 1830 5\' 9"  (1.753 m)     Head Circumference --      Peak Flow --      Pain Score 06/06/18 1835 4     Pain Loc --      Pain Edu? --      Excl. in GC? --     Constitutional: Alert and oriented. Well appearing and in no acute distress. Answers questions appropriately. Eyes: Conjunctivae are normal.  EOMI. No scleral icterus. Head: Atraumatic. Nose: No congestion/rhinnorhea. Mouth/Throat: Mucous membranes are moist.  Neck: No stridor.  Supple.   Cardiovascular: Normal rate, regular rhythm. No murmurs, rubs or gallops.  Respiratory: Normal respiratory effort.  No accessory muscle use or retractions. Lungs CTAB.  No wheezes, rales or ronchi. Gastrointestinal:  Soft, nontender and nondistended.  No guarding or rebound.  No peritoneal signs. Genitourinary: Deferred for Dr. Valentino Saxon. Musculoskeletal: No LE edema.  Neurologic:  A&Ox3.  Speech is clear.  Face and smile are symmetric.  EOMI.  Moves all extremities well. Skin:  Skin is warm, dry and intact. No rash noted. Psychiatric: Mood and affect are normal. Speech and behavior are normal.  Normal judgement.  ____________________________________________   LABS (all labs ordered are listed, but  only abnormal results are displayed)  Labs Reviewed  HCG, QUANTITATIVE, PREGNANCY - Abnormal; Notable for the following components:      Result Value   hCG, Beta Chain, Quant, S 12,440 (*)    All other components within normal limits  CULTURE, BETA STREP (GROUP B ONLY)  CHLAMYDIA/NGC RT PCR ([facility] ONLY)  CBC  URINALYSIS, ROUTINE W REFLEX MICROSCOPIC  ABO/RH   ____________________________________________  EKG  Not indicated ____________________________________________  RADIOLOGY  US Ob Limited  Result Date: 06/06/2018 CLINICAL DATA:  Abdominal discomfort for 3 weeks EXAM: LIMITED OBSTETRIC ULTRASOUND FINDINGS: Number of Fetuses: 1 Heart Rate:  158 bpm Movement: Present per sonographer Presentation: Cephalic Placental Location: Posterior Previa: None Amniotic Fluid (Subjective): Decreased with no significant fluid visualized. AFI: No measurable fluid pockets. FL: 2.8 cm 18 w  4 d MATERNAL FINDINGS: Cervix:  Poorly visualized Uterus/Adnexae: Ovaries are not seen. IMPRESSION: Single intrauterine pregnancy with fetal cardiac activity of 158 BPM. Abnormal decreased quantity of amniotic fluid with no measurable fluid pockets. Cervix is poorly evaluated. This exam is performed on an emergent basis and does not comprehensively evaluate fetal size, dating, or anatomy; follow-up complete OB US should be considered if further fetal assessment is warranted. Electronically Signed   By: Jasmine Pang M.D.   On: 06/06/2018 20:12    ____________________________________________   PROCEDURES  Procedure(s) performed: None  Procedures  Critical Care performed: No ____________________________________________   INITIAL IMPRESSION / ASSESSMENT AND PLAN / ED COURSE  Pertinent labs & imaging results that were available during my care of the patient were reviewed by me and considered in my medical decision making (see chart for details).  28 y.o. female G1, P0 approximately [redacted] weeks  pregnant presenting for gush of fluid with an ultrasound that is consistent with membrane rupture.  Overall, the patient is hemodynamically stable and afebrile.  Dr. Valentino Saxon has seen and evaluated the patient and had a long discussion about the options of induction versus waiting.  The patient has opted for continuation of the pregnancy at this time, she is not having any contractions.  She will be treated by Dr. Valentino Saxon with antibiotics and anticipate discharge with oral antibiotics and close follow-up.  ____________________________________________  FINAL CLINICAL IMPRESSION(S) / ED DIAGNOSES  Final diagnoses:  Premature rupture of membranes in second trimester, unspecified duration to onset of labor         NEW MEDICATIONS STARTED DURING THIS VISIT:  Current Discharge Medication List    START taking these medications   Details  azithromycin (ZITHROMAX) 500 MG tablet Take 1 tablet (500 mg total) by mouth daily for 10 days. Take 1 tablet daily for 3 days. Qty: 10 tablet, Refills: 0    erythromycin base (E-MYCIN) 500 MG tablet Take 1 tablet (500 mg total) by mouth every 6 (six) hours for 10 days. Qty: 40 tablet, Refills: 0          Rockne Menghini, MD 06/06/18 2243

## 2018-06-06 NOTE — Discharge Instructions (Addendum)
Please take the antibiotics as prescribed by Dr. Valentino Saxon.  Return to the emergency department if you develop severe pain, vaginal bleeding, lightheadedness or fainting, fever, or any other symptoms concerning to you.

## 2018-06-07 LAB — URINALYSIS, ROUTINE W REFLEX MICROSCOPIC
Bacteria, UA: NONE SEEN
Bilirubin Urine: NEGATIVE
Glucose, UA: NEGATIVE mg/dL
Ketones, ur: 20 mg/dL — AB
Leukocytes,Ua: NEGATIVE
Nitrite: NEGATIVE
Protein, ur: NEGATIVE mg/dL
Specific Gravity, Urine: 1.014 (ref 1.005–1.030)
pH: 5 (ref 5.0–8.0)

## 2018-06-09 LAB — CULTURE, BETA STREP (GROUP B ONLY)

## 2018-06-10 ENCOUNTER — Encounter: Payer: Self-pay | Admitting: Obstetrics and Gynecology

## 2018-06-10 ENCOUNTER — Other Ambulatory Visit: Payer: BLUE CROSS/BLUE SHIELD

## 2018-06-10 ENCOUNTER — Ambulatory Visit: Payer: BLUE CROSS/BLUE SHIELD | Admitting: Obstetrics and Gynecology

## 2018-06-10 ENCOUNTER — Ambulatory Visit (INDEPENDENT_AMBULATORY_CARE_PROVIDER_SITE_OTHER): Payer: BLUE CROSS/BLUE SHIELD | Admitting: Obstetrics and Gynecology

## 2018-06-10 VITALS — BP 120/84 | HR 104 | Wt 257.4 lb

## 2018-06-10 DIAGNOSIS — O0992 Supervision of high risk pregnancy, unspecified, second trimester: Secondary | ICD-10-CM

## 2018-06-10 DIAGNOSIS — O4290 Premature rupture of membranes, unspecified as to length of time between rupture and onset of labor, unspecified weeks of gestation: Secondary | ICD-10-CM

## 2018-06-10 MED ORDER — CEPHALEXIN 500 MG PO CAPS: 500.0000 mg | ORAL_CAPSULE | Freq: Four times a day (QID) | ORAL | 0 refills | Status: DC

## 2018-06-10 NOTE — Progress Notes (Signed)
Problem OB visit: Patient following up from ER visit last Thursday due to preterm (pre-viable) rupture of membranes.  At ER visit patient opted for expectant management. Is currently on antibiotics for latency for 10 days (Erythromycin and Keflex).  Is on modified bedrest.  Patient notes that she will need FMLA paperwork filled out as she is currently unable to work. Will move anatomy scan to this Friday and reassess fluid levels.  Given strict PTL precautions. RTC on Tuesday for next routine OB appointment. Will need referral to MFM around 22 weeks (patient prefers [facility]).  Will also begin course of antenatal steroids around 22-23 weeks.

## 2018-06-11 ENCOUNTER — Encounter: Payer: BLUE CROSS/BLUE SHIELD | Admitting: Obstetrics and Gynecology

## 2018-06-12 ENCOUNTER — Other Ambulatory Visit: Payer: Self-pay | Admitting: Obstetrics and Gynecology

## 2018-06-12 DIAGNOSIS — O4290 Premature rupture of membranes, unspecified as to length of time between rupture and onset of labor, unspecified weeks of gestation: Secondary | ICD-10-CM

## 2018-06-12 DIAGNOSIS — Z3492 Encounter for supervision of normal pregnancy, unspecified, second trimester: Secondary | ICD-10-CM

## 2018-06-13 ENCOUNTER — Ambulatory Visit
Admission: RE | Admit: 2018-06-13 | Discharge: 2018-06-13 | Disposition: A | Discharge status: Home / Self Care | Payer: BLUE CROSS/BLUE SHIELD | Source: Ambulatory Visit | Attending: Obstetrics and Gynecology | Admitting: Obstetrics and Gynecology

## 2018-06-13 DIAGNOSIS — Z3492 Encounter for supervision of normal pregnancy, unspecified, second trimester: Secondary | ICD-10-CM | POA: Insufficient documentation

## 2018-06-13 DIAGNOSIS — O4290 Premature rupture of membranes, unspecified as to length of time between rupture and onset of labor, unspecified weeks of gestation: Secondary | ICD-10-CM | POA: Diagnosis not present

## 2018-06-18 ENCOUNTER — Encounter: Payer: BLUE CROSS/BLUE SHIELD | Admitting: Obstetrics and Gynecology

## 2018-06-18 ENCOUNTER — Other Ambulatory Visit: Payer: BLUE CROSS/BLUE SHIELD

## 2018-06-18 ENCOUNTER — Telehealth: Payer: Self-pay | Admitting: Obstetrics and Gynecology

## 2018-06-18 NOTE — Telephone Encounter (Signed)
The patient called and stated that she would like to speak with her nurse as soon as possible in regards to the status of her FMLA paperwork. Please advise.

## 2018-06-19 ENCOUNTER — Telehealth: Payer: Self-pay | Admitting: Obstetrics and Gynecology

## 2018-06-19 NOTE — Telephone Encounter (Signed)
VT spoke with pt. Please see phone encounter.

## 2018-06-19 NOTE — Telephone Encounter (Signed)
I called Ms. Zupko 8:35 this morning and informed her the FMLA paperwork for her and her husband is ready for pick up, and her paperwork stated to NOT fax but to give back to the patient.  She stated she would be in later today, 06/19/18, to pick them up.  *NO FURTHER ACTION REQUIRED AT THIS TIME*

## 2018-06-19 NOTE — Telephone Encounter (Signed)
Patient called back after picking up her FMLA paperwork for her and her husband, and she states her work needs additional information to include the following:  A note on office letterhead that states, patient's name, date of complication on 06/06/18, first day of missed work 06/11/18 and any information that pertains to the reason for her missing work, and then the signature of the doctor.   The patient would like to be called to pick this up as she has to scan it in to her work as they do not allow faxing.  Please advise, thanks.

## 2018-06-26 NOTE — Telephone Encounter (Signed)
Pt was back and informed that her letter was printed and signed by [facility] and ready for pick up. Pt stated that she was on her way to pick it up now.

## 2019-01-23 ENCOUNTER — Encounter (HOSPITAL_COMMUNITY): Payer: Self-pay

## 2019-02-12 DIAGNOSIS — R002 Palpitations: Secondary | ICD-10-CM | POA: Insufficient documentation

## 2019-06-19 DIAGNOSIS — F419 Anxiety disorder, unspecified: Secondary | ICD-10-CM | POA: Insufficient documentation

## 2019-12-04 ENCOUNTER — Ambulatory Visit
Admission: EM | Admit: 2019-12-04 | Discharge: 2019-12-04 | Disposition: A | Discharge status: Home / Self Care | Payer: BC Managed Care – PPO | Attending: Physician Assistant | Admitting: Physician Assistant

## 2019-12-04 ENCOUNTER — Other Ambulatory Visit: Payer: Self-pay

## 2019-12-04 DIAGNOSIS — M545 Low back pain, unspecified: Secondary | ICD-10-CM

## 2019-12-04 DIAGNOSIS — S39012A Strain of muscle, fascia and tendon of lower back, initial encounter: Secondary | ICD-10-CM | POA: Diagnosis not present

## 2019-12-04 MED ORDER — BACLOFEN 20 MG PO TABS: 20.0000 mg | ORAL_TABLET | Freq: Every evening | ORAL | 0 refills | Status: AC

## 2019-12-04 MED ORDER — DICLOFENAC SODIUM 75 MG PO TBEC: 75.0000 mg | DELAYED_RELEASE_TABLET | Freq: Two times a day (BID) | ORAL | 0 refills | Status: AC

## 2019-12-04 NOTE — ED Provider Notes (Signed)
[facility]    CSN: [id number] Arrival date & time: 12/04/19  1158      History   Chief Complaint Chief Complaint  Patient presents with  . Back Pain    Lumbar    HPI Tanita Palinkas is a 29 y.o. female.   29 y/o female presents for 1 month history of lumbar back pain. She denies injury and says pain has progressively worsened. Pain is worse with a lot of movement and improved with rest. Pain worsened with bending over and walking. Pain not worsened with sitting. Pain sometimes radiates to the right lateral thigh. Denies numbness/tingling/weakness. Pain currently 6-7/10. She says she sits a lot a work and is also up and down with her 56 month old son. Denies prior back injury or surgery. Has tried Tylenol and Goody's Powder without relief. Denies fever, abdominal pain, dysuria, vaginal discharge, hematuria. No other symptoms or concerns today.     Past Medical History:  Diagnosis Date  . PCOS (polycystic ovarian syndrome) 2013    Patient Active Problem List   Diagnosis Date Noted  . 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 No pertinent surgical history.  OB History    Gravida  1   Para      Term      Preterm      AB      Living        SAB      TAB      Ectopic      Multiple      Live Births               Home Medications    Prior to Admission medications   Medication Sig Start Date End Date Taking? Authorizing Provider  Prenatal Vit-Fe Fumarate-FA (PRENATAL MULTIVITAMIN) TABS tablet Take 1 tablet by mouth daily at 12 noon.   Yes [provider]  baclofen (LIORESAL) 20 MG tablet Take 1 tablet (20 mg total) by mouth at bedtime. 12/04/19 01/03/20  Shirlee Latch, PA-C  cephALEXin (KEFLEX) 500 MG capsule Take 1 capsule (500 mg total) by mouth 4 (four) times daily. 06/10/18   Hildred Laser, MD  diclofenac (VOLTAREN) 75 MG EC tablet Take 1 tablet (75 mg total) by mouth 2 (two)  times daily. 12/04/19 01/03/20  Eusebio Friendly B, PA-C  hydrocortisone cream 0.5 % Apply 1 application topically 2 (two) times daily. 05/21/18   Hildred Laser, MD  meclizine (ANTIVERT) 25 MG tablet Take 25 mg by mouth as needed for nausea. 06/06/18   [provider]    Family History Family History  Problem Relation Age of Onset  . Heart disease Mother     Social History Social History   Tobacco Use  . Smoking status: Never Smoker  . Smokeless tobacco: Never Used  Vaping Use  . Vaping Use: Never used  Substance Use Topics  . Alcohol use: Yes  . Drug use: Never     Allergies   Penicillins   Review of Systems Review of Systems  Constitutional: Negative for fatigue and fever.  Gastrointestinal: Negative for abdominal pain, diarrhea, nausea and vomiting.  Genitourinary: Negative for difficulty urinating, flank pain, frequency, pelvic pain, urgency and vaginal discharge.  Musculoskeletal: Positive for back pain. Negative for arthralgias, gait problem and joint swelling.  Skin: Negative for rash.  Neurological: Negative for weakness and numbness.     Physical Exam Triage Vital Signs ED Triage Vitals  Enc Vitals Group     BP 12/04/19 1253 (!) 135/106     Pulse Rate 12/04/19 1253 81     Resp 12/04/19 1253 18     Temp 12/04/19 1253 99.1 F (37.3 C)     Temp Source 12/04/19 1253 Oral     SpO2 12/04/19 1253 100 %     Weight 12/04/19 1254 194 lb (88 kg)     Height 12/04/19 1254 5\' 9"  (1.753 m)     Head Circumference --      Peak Flow --      Pain Score 12/04/19 1253 7     Pain Loc --      Pain Edu? --      Excl. in GC? --    No data found.  Updated Vital Signs BP (!) 135/106 (BP Location: Right Arm)   Pulse 81   Temp 99.1 F (37.3 C) (Oral)   Resp 18   Ht 5\' 9"  (1.753 m)   Wt 194 lb (88 kg)   LMP 11/30/2019   SpO2 100%   BMI 28.65 kg/m       Physical Exam Vitals and nursing note reviewed.  Constitutional:      General: She is not in acute  distress.    Appearance: Normal appearance. She is not ill-appearing or toxic-appearing.  HENT:     Head: Normocephalic and atraumatic.  Eyes:     General: No scleral icterus.       Right eye: No discharge.        Left eye: No discharge.     Conjunctiva/sclera: Conjunctivae normal.  Cardiovascular:     Rate and Rhythm: Normal rate and regular rhythm.     Heart sounds: Normal heart sounds.  Pulmonary:     Effort: Pulmonary effort is normal. No respiratory distress.     Breath sounds: Normal breath sounds.  Musculoskeletal:     Cervical back: Neck supple.     Lumbar back: No signs of trauma, tenderness or bony tenderness. Decreased range of motion (decreased forward flexion and bilateral lateral flexion). Negative right straight leg raise test and negative left straight leg raise test.     Comments: 5/5 strength bilat LEs  Skin:    General: Skin is dry.  Neurological:     General: No focal deficit present.     Mental Status: She is alert. Mental status is at baseline.     Motor: No weakness.     Gait: Gait normal.  Psychiatric:        Mood and Affect: Mood normal.        Behavior: Behavior normal.        Thought Content: Thought content normal.      UC Treatments / Results  Labs (all labs ordered are listed, but only abnormal results are displayed) Labs Reviewed - No data to display  EKG   Radiology No results found.  Procedures Procedures (including critical care time)  Medications Ordered in UC Medications - No data to display  Initial Impression / Assessment and Plan / UC Course  I have reviewed the triage vital signs and the nursing notes.  Pertinent labs & imaging results that were available during my care of the patient were reviewed by me and considered in my medical decision making (see chart for details).   29 y/o female presenting for bilateral lumbosacral pain x 1 month. Exam suggestive of lumbosacral strain. Imaging not obtained--no history of injury  or trauma to back  or concerning red flag symptoms. She denies loss of bowel/bladder control, saddle anesthesia, leg weakness. Sent rx for diclofenac and encouraged position changes, exercise and stretches. Sent Baclofen for bed time. F/u if new/worsening symptoms.  Final Clinical Impressions(s) / UC Diagnoses   Final diagnoses:  Lumbosacral strain, initial encounter  Acute bilateral low back pain without sciatica     Discharge Instructions     BACK PAIN: Stressed avoiding painful activities . RICE (REST, ICE, COMPRESSION, ELEVATION) guidelines reviewed. May alternate ice and heat. Consider use of muscle rubs, Salonpas patches, etc. Use medications as directed including muscle relaxers if prescribed. Take anti-inflammatory medications as prescribed or OTC NSAIDs/Tylenol.  F/u with PCP in 7-10 days for reexamination, and please feel free to call or return to the urgent care at any time for any questions or concerns you may have and we will be happy to help you!   BACK PAIN RED FLAGS: If the back pain acutely worsens or there are any red flag symptoms such as numbness/tingling, leg weakness, saddle anesthesia, or loss of bowel/bladder control, go immediately to the ER. Follow up with [place] as scheduled or sooner if the pain does not begin to resolve or if it worsens before the follow up      ED Prescriptions    Medication Sig Dispense Auth. Provider   diclofenac (VOLTAREN) 75 MG EC tablet Take 1 tablet (75 mg total) by mouth 2 (two) times daily. 60 tablet Eusebio Friendly B, PA-C   baclofen (LIORESAL) 20 MG tablet Take 1 tablet (20 mg total) by mouth at bedtime. 30 tablet Gareth Morgan     PDMP not reviewed this encounter.   Shirlee Latch, PA-C 12/04/19 1636

## 2019-12-04 NOTE — Discharge Instructions (Signed)

## 2019-12-04 NOTE — ED Triage Notes (Signed)
Patient in today w/ c/o lumbar back pain. Patient states NKI. This has been ongoing x 1 mo. Patient states aching constantly with sharp intermittent pain. Patient works from home and has a 34 month old son.

## 2020-04-10 ENCOUNTER — Encounter: Payer: Self-pay | Admitting: Emergency Medicine

## 2020-04-10 ENCOUNTER — Other Ambulatory Visit: Payer: Self-pay

## 2020-04-10 DIAGNOSIS — K8050 Calculus of bile duct without cholangitis or cholecystitis without obstruction: Secondary | ICD-10-CM | POA: Diagnosis not present

## 2020-04-10 DIAGNOSIS — R1012 Left upper quadrant pain: Secondary | ICD-10-CM | POA: Diagnosis present

## 2020-04-10 NOTE — ED Triage Notes (Signed)
Pt arrived via POV with reports of LUQ abd pain and vomiting that started today, pt describes the pain as a burning sensation.  Pt vomited x2. Denies any nausea at this time.

## 2020-04-11 ENCOUNTER — Emergency Department: Payer: BC Managed Care – PPO

## 2020-04-11 ENCOUNTER — Emergency Department
Admission: EM | Admit: 2020-04-11 | Discharge: 2020-04-11 | Disposition: A | Discharge status: Home / Self Care | Payer: BC Managed Care – PPO | Attending: Emergency Medicine | Admitting: Emergency Medicine

## 2020-04-11 DIAGNOSIS — R1010 Upper abdominal pain, unspecified: Secondary | ICD-10-CM

## 2020-04-11 DIAGNOSIS — K8050 Calculus of bile duct without cholangitis or cholecystitis without obstruction: Secondary | ICD-10-CM

## 2020-04-11 LAB — CBC
HCT: 36 % (ref 36.0–46.0)
Hemoglobin: 12 g/dL (ref 12.0–15.0)
MCH: 27.2 pg (ref 26.0–34.0)
MCHC: 33.3 g/dL (ref 30.0–36.0)
MCV: 81.6 fL (ref 80.0–100.0)
Platelets: 350 10*3/uL (ref 150–400)
RBC: 4.41 MIL/uL (ref 3.87–5.11)
RDW: 13.2 % (ref 11.5–15.5)
WBC: 10.8 10*3/uL — ABNORMAL HIGH (ref 4.0–10.5)
nRBC: 0 % (ref 0.0–0.2)

## 2020-04-11 LAB — URINALYSIS, COMPLETE (UACMP) WITH MICROSCOPIC
Bacteria, UA: NONE SEEN
Bilirubin Urine: NEGATIVE
Glucose, UA: NEGATIVE mg/dL
Ketones, ur: NEGATIVE mg/dL
Leukocytes,Ua: NEGATIVE
Nitrite: NEGATIVE
Protein, ur: NEGATIVE mg/dL
Specific Gravity, Urine: 1.013 (ref 1.005–1.030)
pH: 7 (ref 5.0–8.0)

## 2020-04-11 LAB — COMPREHENSIVE METABOLIC PANEL
ALT: 72 U/L — ABNORMAL HIGH (ref 0–44)
AST: 174 U/L — ABNORMAL HIGH (ref 15–41)
Albumin: 4 g/dL (ref 3.5–5.0)
Alkaline Phosphatase: 35 U/L — ABNORMAL LOW (ref 38–126)
Anion gap: 9 (ref 5–15)
BUN: 15 mg/dL (ref 6–20)
CO2: 24 mmol/L (ref 22–32)
Calcium: 9.2 mg/dL (ref 8.9–10.3)
Chloride: 106 mmol/L (ref 98–111)
Creatinine, Ser: 0.84 mg/dL (ref 0.44–1.00)
GFR, Estimated: >60 mL/min (ref >60)
Glucose, Bld: 131 mg/dL — ABNORMAL HIGH (ref 70–99)
Potassium: 3.6 mmol/L (ref 3.5–5.1)
Sodium: 139 mmol/L (ref 135–145)
Total Bilirubin: 0.6 mg/dL (ref 0.3–1.2)
Total Protein: 7.5 g/dL (ref 6.5–8.1)

## 2020-04-11 LAB — LIPASE, BLOOD: Lipase: 37 U/L (ref 11–51)

## 2020-04-11 MED ORDER — HYDROCODONE-ACETAMINOPHEN 5-325 MG PO TABS: 1.0000 | ORAL_TABLET | Freq: Four times a day (QID) | ORAL | 0 refills | Status: DC | PRN

## 2020-04-11 MED ORDER — KETOROLAC TROMETHAMINE 30 MG/ML IJ SOLN
30.0000 mg | Freq: Once | INTRAMUSCULAR | Status: AC
  Administered 2020-04-11: 30 mg via INTRAMUSCULAR
  Filled 2020-04-11: qty 1

## 2020-04-11 NOTE — ED Notes (Signed)
Pt. POC was NEGATIVE. 

## 2020-04-11 NOTE — ED Provider Notes (Signed)
[facility] Emergency Department Provider Note   ____________________________________________    I have reviewed the triage vital signs and the nursing notes.   HISTORY  Chief Complaint Abdominal Pain     HPI Victoria Kerr is a 29 y.o. female with a history of polycystic ovarian syndrome who presents with complaints of left-sided abdominal pain.  Patient describes bilateral upper abdominal pain with left greater than right, also describes some left flank pain.  No dysuria, no hematuria noted.  No history of kidney stones.  No fevers chills nausea or vomiting.  Has not take anything for this.  Has never had this before.  Past Medical History:  Diagnosis Date  . PCOS (polycystic ovarian syndrome) 2013    Patient Active Problem List   Diagnosis Date Noted  . 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 No pertinent surgical history.  Prior to Admission medications   Medication Sig Start Date End Date Taking? Authorizing Provider  cephALEXin (KEFLEX) 500 MG capsule Take 1 capsule (500 mg total) by mouth 4 (four) times daily. 06/10/18   Hildred Laser, MD  HYDROcodone-acetaminophen (NORCO/VICODIN) 5-325 MG tablet Take 1 tablet by mouth every 6 (six) hours as needed for severe pain. 04/11/20   Jene Every, MD  hydrocortisone cream 0.5 % Apply 1 application topically 2 (two) times daily. 05/21/18   Hildred Laser, MD  meclizine (ANTIVERT) 25 MG tablet Take 25 mg by mouth as needed for nausea. 06/06/18   [provider]  Prenatal Vit-Fe Fumarate-FA (PRENATAL MULTIVITAMIN) TABS tablet Take 1 tablet by mouth daily at 12 noon.    [provider]     Allergies Penicillins and Wound dressing adhesive  Family History  Problem Relation Age of Onset  . Heart disease Mother     Social History Social History   Tobacco Use  . Smoking status: Never Smoker  . Smokeless tobacco: Never Used   Vaping Use  . Vaping Use: Never used  Substance Use Topics  . Alcohol use: Yes  . Drug use: Never    Review of Systems  Constitutional: No fever/chills Eyes: No visual changes.  ENT: No sore throat. Cardiovascular: Denies chest pain. Respiratory: Denies shortness of breath. Gastrointestinal: As above Genitourinary: As above Musculoskeletal: Negative for back pain. Skin: Negative for rash. Neurological: Negative for headaches or weakness   ____________________________________________   PHYSICAL EXAM:  VITAL SIGNS: ED Triage Vitals  Enc Vitals Group     BP 04/10/20 2329 134/84     Pulse Rate 04/10/20 2329 88     Resp 04/10/20 2329 18     Temp 04/10/20 2329 99.5 F (37.5 C)     Temp Source 04/10/20 2329 Oral     SpO2 04/10/20 2329 100 %     Weight 04/10/20 2331 93.9 kg (207 lb)     Height 04/10/20 2331 1.753 m (5\' 9" )     Head Circumference --      Peak Flow --      Pain Score 04/10/20 2331 8     Pain Loc --      Pain Edu? --      Excl. in GC? --     Constitutional: Alert and oriented.   Nose: No congestion/rhinnorhea. Mouth/Throat: Mucous membranes are moist.   Neck:  Painless ROM Cardiovascular: Normal rate, regular rhythm. Grossly normal heart sounds.  Good peripheral circulation. Respiratory: Normal respiratory effort.  No retractions. Lungs CTAB. Gastrointestinal: Soft and  nontender. No distention.  Mild left CVA tenderness  Musculoskeletal: No lower extremity tenderness nor edema.  Warm and well perfused Neurologic:  Normal speech and language. No gross focal neurologic deficits are appreciated.  Skin:  Skin is warm, dry and intact. No rash noted. Psychiatric: Mood and affect are normal. Speech and behavior are normal.  ____________________________________________   LABS (all labs ordered are listed, but only abnormal results are displayed)  Labs Reviewed  COMPREHENSIVE METABOLIC PANEL - Abnormal; Notable for the following components:       Result Value   Glucose, Bld 131 (*)    AST 174 (*)    ALT 72 (*)    Alkaline Phosphatase 35 (*)    All other components within normal limits  CBC - Abnormal; Notable for the following components:   WBC 10.8 (*)    All other components within normal limits  URINALYSIS, COMPLETE (UACMP) WITH MICROSCOPIC - Abnormal; Notable for the following components:   Color, Urine YELLOW (*)    APPearance CLEAR (*)    Hgb urine dipstick SMALL (*)    All other components within normal limits  LIPASE, BLOOD  POC URINE PREG, ED   ____________________________________________  EKG  ED ECG REPORT I, Jene Every, the attending physician, personally viewed and interpreted this ECG.  Date: 04/11/2020  Rhythm: normal sinus rhythm QRS Axis: normal Intervals: normal ST/T Wave abnormalities: normal Narrative Interpretation: no evidence of acute ischemia  ____________________________________________  RADIOLOGY  CT renal stone study reviewed by me, ____________________________________________   PROCEDURES  Procedure(s) performed: No  Procedures   Critical Care performed: No ____________________________________________   INITIAL IMPRESSION / ASSESSMENT AND PLAN / ED COURSE  Pertinent labs & imaging results that were available during my care of the patient were reviewed by me and considered in my medical decision making (see chart for details).  Patient presents with abdominal pain as above, appears to be mostly left flank pain, will send for CT renal stone study to evaluate for kidney stone.  Will treat with IV Toradol, pregnancy test is negative.  Urinalysis is reassuring.  Patient had significant provement of her IV Toradol, CT scan negative for ureterolithiasis, suspicious for possible cholelithiasis/cholecystitis, will send for ultrasound.  Ultrasound demonstrates cholelithiasis however no evidence of cholecystitis.  Patient remains significantly improved, appropriate for discharge  at this time, will refer to general surgery for outpatient follow-up, return precautions discussed    ____________________________________________   FINAL CLINICAL IMPRESSION(S) / ED DIAGNOSES  Final diagnoses:  Upper abdominal pain  Biliary colic        Note:  This document was prepared using Dragon voice recognition software and may include unintentional dictation errors.   Jene Every, MD 04/11/20 [phone]

## 2020-04-12 ENCOUNTER — Telehealth: Payer: Self-pay

## 2020-04-12 LAB — POC URINE PREG, ED: Preg Test, Ur: NEGATIVE

## 2020-04-12 NOTE — Telephone Encounter (Signed)
LVM for pt to call back to schedule an appt due to being seen twice in ER this weekend.

## 2020-04-19 ENCOUNTER — Telehealth: Payer: Self-pay

## 2020-04-19 NOTE — Telephone Encounter (Signed)
Pt called back stating that she does not need an appt at this time due to not wanting gallbladder surgery. She states that she has an 11 month child and she can be down for any periods of time. Pt will call back if she has another attack to schedule an appt.

## 2020-04-19 NOTE — Telephone Encounter (Signed)
Left another message for pt to call our office to schedule an ED follow up appt with Dr. Claudine Mouton.

## 2020-05-06 ENCOUNTER — Ambulatory Visit: Payer: Self-pay | Admitting: Surgery

## 2020-05-11 ENCOUNTER — Encounter: Payer: Self-pay | Admitting: General Surgery

## 2020-05-11 ENCOUNTER — Ambulatory Visit (INDEPENDENT_AMBULATORY_CARE_PROVIDER_SITE_OTHER): Payer: BC Managed Care – PPO | Admitting: General Surgery

## 2020-05-11 ENCOUNTER — Other Ambulatory Visit: Payer: Self-pay

## 2020-05-11 VITALS — BP 126/90 | HR 98 | Temp 98.2°F | Ht 69.0 in | Wt 213.0 lb

## 2020-05-11 DIAGNOSIS — K8020 Calculus of gallbladder without cholecystitis without obstruction: Secondary | ICD-10-CM | POA: Diagnosis not present

## 2020-05-11 NOTE — Progress Notes (Signed)
Patient ID: Victoria Kerr, female   DOB: 08/18/1990, 30 y.o.   MRN: [id number]  Chief Complaint  Patient presents with  . New Patient (Initial Visit)    Gallbladder     HPI Victoria Kerr is a 30 y.o. female.   She is here today as follow-up from the emergency department where she was seen for upper abdominal pain.  She reports that her first episode was on December 25.  She had severe epigastric and upper abdominal pain that was associated with nausea and vomiting.  She presented to the emergency department where a right upper quadrant ultrasound demonstrated cholelithiasis without cholecystitis.  She improved while in the emergency room and was sent home.  She initially felt like she did not want to undergo surgery due to having a young son at home, but she has continued to have attacks.  It seems to happen whenever she eats and does not matter what specifically she eats.  She continues to experience nausea and vomiting every time she has an attack.  She has not had any fevers or chills.  No diarrhea.  She has never had pancreatitis.  She has never had jaundice, acholic stools, or dark, [employer] colored urine.  She is interested in having her gallbladder removed due to the increased frequency and severity of her attacks.   Past Medical History:  Diagnosis Date  . PCOS (polycystic ovarian syndrome) 2013    No past surgical history on file. Reviewed, no prior operations.  Family History  Problem Relation Age of Onset  . Heart attack Mother     Social History Social History   Tobacco Use  . Smoking status: Never Smoker  . Smokeless tobacco: Never Used  Vaping Use  . Vaping Use: Never used  Substance Use Topics  . Alcohol use: Yes  . Drug use: Never    Allergies  Allergen Reactions  . Penicillins Hives and Rash    Unknown childhood reaction.   . Wound Dressing Adhesive     bandaid adhesives    No current outpatient medications on file.   No current  facility-administered medications for this visit.    Review of Systems Review of Systems  Gastrointestinal: Positive for abdominal pain, nausea and vomiting.  All other systems reviewed and are negative. Or as discussed in the history of present illness.  Blood pressure 126/90, pulse 98, temperature 98.2 F (36.8 C), height 5' 9" (1.753 m), weight 213 lb (96.6 kg), last menstrual period 03/19/2020, SpO2 99 %, currently breastfeeding.  Physical Exam Physical Exam Constitutional:      General: She is not in acute distress.    Appearance: Normal appearance. She is obese.  HENT:     Head: Normocephalic and atraumatic.     Nose:     Comments: Covered with a mask    Mouth/Throat:     Comments: Covered with a mask Eyes:     General: No scleral icterus.       Right eye: No discharge.        Left eye: No discharge.  Neck:     Comments: No palpable cervical or supraclavicular lymphadenopathy.  The trachea is midline.  There are no dominant thyroid masses or thyromegaly appreciated.  The gland was freely with deglutition. Cardiovascular:     Rate and Rhythm: Normal rate and regular rhythm.     Pulses: Normal pulses.  Pulmonary:     Effort: Pulmonary effort is normal. No respiratory distress.       Breath sounds: Normal breath sounds.  Abdominal:     General: Abdomen is flat. Bowel sounds are normal. There is no distension.     Palpations: Abdomen is soft.     Tenderness: There is no abdominal tenderness. There is no guarding.  Genitourinary:    Comments: Deferred Musculoskeletal:        General: No swelling or tenderness. Normal range of motion.     Cervical back: Normal range of motion and neck supple.  Skin:    General: Skin is warm and dry.  Neurological:     General: No focal deficit present.     Mental Status: She is alert and oriented to person, place, and time.  Psychiatric:        Mood and Affect: Mood normal.        Behavior: Behavior normal.     Data  Reviewed Results for Victoria Kerr, Victoria Kerr (MRN [id number]) as of 05/11/2020 09:58  Ref. Range 04/10/2020 23:49  Sodium Latest Ref Range: 135 - 145 mmol/L 139  Potassium Latest Ref Range: 3.5 - 5.1 mmol/L 3.6  Chloride Latest Ref Range: 98 - 111 mmol/L 106  CO2 Latest Ref Range: 22 - 32 mmol/L 24  Glucose Latest Ref Range: 70 - 99 mg/dL 742 (H)  BUN Latest Ref Range: 6 - 20 mg/dL 15  Creatinine Latest Ref Range: 0.44 - 1.00 mg/dL 5.95  Calcium Latest Ref Range: 8.9 - 10.3 mg/dL 9.2  Anion gap Latest Ref Range: 5 - 15  9  Alkaline Phosphatase Latest Ref Range: 38 - 126 U/L 35 (L)  Albumin Latest Ref Range: 3.5 - 5.0 g/dL 4.0  Lipase Latest Ref Range: 11 - 51 U/L 37  AST Latest Ref Range: 15 - 41 U/L 174 (H)  ALT Latest Ref Range: 0 - 44 U/L 72 (H)  Total Protein Latest Ref Range: 6.5 - 8.1 g/dL 7.5  Total Bilirubin Latest Ref Range: 0.3 - 1.2 mg/dL 0.6  GFR, Estimated Latest Ref Range: >60 mL/min >60  WBC Latest Ref Range: 4.0 - 10.5 K/uL 10.8 (H)  RBC Latest Ref Range: 3.87 - 5.11 MIL/uL 4.41  Hemoglobin Latest Ref Range: 12.0 - 15.0 g/dL 63.8  HCT Latest Ref Range: 36.0 - 46.0 % 36.0  MCV Latest Ref Range: 80.0 - 100.0 fL 81.6  MCH Latest Ref Range: 26.0 - 34.0 pg 27.2  MCHC Latest Ref Range: 30.0 - 36.0 g/dL 75.6  RDW Latest Ref Range: 11.5 - 15.5 % 13.2  Platelets Latest Ref Range: 150 - 400 K/uL 350  nRBC Latest Ref Range: 0.0 - 0.2 % 0.0  These are the labs that were obtained while she was in the emergency department.  She has mild elevations of her transaminases, but her bilirubin is normal.  Her white blood cell count is only slightly above normal.  Results for Victoria, Kerr (MRN [id number]) as of 05/11/2020 09:58  Ref. Range 04/01/2018 10:26  Hgb A Latest Ref Range: 96.4 - 98.8 % 63.0 (L)  Hemoglobin F Quantitation Latest Ref Range: 0.0 - 2.0 % 0.0  HGB S Latest Ref Range: 0.0 % 33.1 (H)  HGB C Latest Ref Range: 0.0 % 0.0  HGB VARIANT Latest Ref Range: 0.0 %  0.0  Sickle Solubility Test - HGBRFX Latest Ref Range: Negative  Positive (A)  Hemoglobin A2 Quantitation Latest Ref Range: 1.8 - 3.2 % 3.9 (H)  HGB INTERPRETATION Unknown Comment  These labs are consistent with sickle cell trait  I personally reviewed the ultrasound  performed on April 11, 2020 and concur with the radiology interpretation copied here: CLINICAL DATA:  Upper abdominal pain for 1 day.  EXAM: ULTRASOUND ABDOMEN LIMITED RIGHT UPPER QUADRANT  COMPARISON:  CT stone study earlier same day.  FINDINGS: Gallbladder:  Multiple gallstones evident, measuring up to 11 mm. No gallbladder wall thickening or pericholecystic fluid. Sonographer reports no sonographic Murphy sign.  Common bile duct:  Diameter: 5 mm  Liver:  No focal lesion identified. Within normal limits in parenchymal echogenicity. Portal vein is patent on color Doppler imaging with normal direction of blood flow towards the liver.  Other: None.  IMPRESSION: Cholelithiasis. No sonographic findings to suggest acute Cholecystitis.  I also reviewed the progress note from her emergency department visit wherein cardiac abnormalities were ruled out and she responded well to Toradol and hydration.  Assessment This is a 30 year old woman with abdominal pain and sonographic findings consistent with symptomatic cholelithiasis.  She is interested in cholecystectomy due to the increased frequency and severity of her symptoms.  Plan I have offered her a robot-assisted laparoscopic cholecystectomy.  I discussed the risks of the operation with her.  These include, but are not limited to, bleeding, infection, damage to surrounding tissues or structures, retained common bile duct stone, bile leak, need for additional interventions, surgeries, or procedures, need to convert to an open operation.  She had the opportunity to ask questions which were answered to her satisfaction.  We will get her scheduled for  surgery.    Duanne Guess 05/11/2020, 9:53 AM

## 2020-05-11 NOTE — H&P (View-Only) (Signed)
Patient ID: Victoria Kerr, female   DOB: 11-16-1990, 30 y.o.   MRN: [id number]  Chief Complaint  Patient presents with  . New Patient (Initial Visit)    Gallbladder     HPI Victoria Kerr is a 30 y.o. female.   She is here today as follow-up from the emergency department where she was seen for upper abdominal pain.  She reports that her first episode was on December 25.  She had severe epigastric and upper abdominal pain that was associated with nausea and vomiting.  She presented to the emergency department where a right upper quadrant ultrasound demonstrated cholelithiasis without cholecystitis.  She improved while in the emergency room and was sent home.  She initially felt like she did not want to undergo surgery due to having a young son at home, but she has continued to have attacks.  It seems to happen whenever she eats and does not matter what specifically she eats.  She continues to experience nausea and vomiting every time she has an attack.  She has not had any fevers or chills.  No diarrhea.  She has never had pancreatitis.  She has never had jaundice, acholic stools, or dark, Coca-[employer]a colored urine.  She is interested in having her gallbladder removed due to the increased frequency and severity of her attacks.   Past Medical History:  Diagnosis Date  . PCOS (polycystic ovarian syndrome) 2013    No past surgical history on file. Reviewed, no prior operations.  Family History  Problem Relation Age of Onset  . Heart attack Mother     Social History Social History   Tobacco Use  . Smoking status: Never Smoker  . Smokeless tobacco: Never Used  Vaping Use  . Vaping Use: Never used  Substance Use Topics  . Alcohol use: Yes  . Drug use: Never    Allergies  Allergen Reactions  . Penicillins Hives and Rash    Unknown childhood reaction.   . Wound Dressing Adhesive     bandaid adhesives    No current outpatient medications on file.   No current  facility-administered medications for this visit.    Review of Systems Review of Systems  Gastrointestinal: Positive for abdominal pain, nausea and vomiting.  All other systems reviewed and are negative. Or as discussed in the history of present illness.  Blood pressure 126/90, pulse 98, temperature 98.2 F (36.8 C), height 5\' 9"  (1.753 m), weight 213 lb (96.6 kg), last menstrual period 03/19/2020, SpO2 99 %, currently breastfeeding.  Physical Exam Physical Exam Constitutional:      General: She is not in acute distress.    Appearance: Normal appearance. She is obese.  HENT:     Head: Normocephalic and atraumatic.     Nose:     Comments: Covered with a mask    Mouth/Throat:     Comments: Covered with a mask Eyes:     General: No scleral icterus.       Right eye: No discharge.        Left eye: No discharge.  Neck:     Comments: No palpable cervical or supraclavicular lymphadenopathy.  The trachea is midline.  There are no dominant thyroid masses or thyromegaly appreciated.  The gland was freely with deglutition. Cardiovascular:     Rate and Rhythm: Normal rate and regular rhythm.     Pulses: Normal pulses.  Pulmonary:     Effort: Pulmonary effort is normal. No respiratory distress.  Breath sounds: Normal breath sounds.  Abdominal:     General: Abdomen is flat. Bowel sounds are normal. There is no distension.     Palpations: Abdomen is soft.     Tenderness: There is no abdominal tenderness. There is no guarding.  Genitourinary:    Comments: Deferred Musculoskeletal:        General: No swelling or tenderness. Normal range of motion.     Cervical back: Normal range of motion and neck supple.  Skin:    General: Skin is warm and dry.  Neurological:     General: No focal deficit present.     Mental Status: She is alert and oriented to person, place, and time.  Psychiatric:        Mood and Affect: Mood normal.        Behavior: Behavior normal.     Data  Reviewed Results for Victoria Kerr, Victoria Kerr (MRN [id number]) as of 05/11/2020 09:58  Ref. Range 04/10/2020 23:49  Sodium Latest Ref Range: 135 - 145 mmol/L 139  Potassium Latest Ref Range: 3.5 - 5.1 mmol/L 3.6  Chloride Latest Ref Range: 98 - 111 mmol/L 106  CO2 Latest Ref Range: 22 - 32 mmol/L 24  Glucose Latest Ref Range: 70 - 99 mg/dL 742 (H)  BUN Latest Ref Range: 6 - 20 mg/dL 15  Creatinine Latest Ref Range: 0.44 - 1.00 mg/dL 5.95  Calcium Latest Ref Range: 8.9 - 10.3 mg/dL 9.2  Anion gap Latest Ref Range: 5 - 15  9  Alkaline Phosphatase Latest Ref Range: 38 - 126 U/L 35 (L)  Albumin Latest Ref Range: 3.5 - 5.0 g/dL 4.0  Lipase Latest Ref Range: 11 - 51 U/L 37  AST Latest Ref Range: 15 - 41 U/L 174 (H)  ALT Latest Ref Range: 0 - 44 U/L 72 (H)  Total Protein Latest Ref Range: 6.5 - 8.1 g/dL 7.5  Total Bilirubin Latest Ref Range: 0.3 - 1.2 mg/dL 0.6  GFR, Estimated Latest Ref Range: >60 mL/min >60  WBC Latest Ref Range: 4.0 - 10.5 K/uL 10.8 (H)  RBC Latest Ref Range: 3.87 - 5.11 MIL/uL 4.41  Hemoglobin Latest Ref Range: 12.0 - 15.0 g/dL 63.8  HCT Latest Ref Range: 36.0 - 46.0 % 36.0  MCV Latest Ref Range: 80.0 - 100.0 fL 81.6  MCH Latest Ref Range: 26.0 - 34.0 pg 27.2  MCHC Latest Ref Range: 30.0 - 36.0 g/dL 75.6  RDW Latest Ref Range: 11.5 - 15.5 % 13.2  Platelets Latest Ref Range: 150 - 400 K/uL 350  nRBC Latest Ref Range: 0.0 - 0.2 % 0.0  These are the labs that were obtained while she was in the emergency department.  She has mild elevations of her transaminases, but her bilirubin is normal.  Her white blood cell count is only slightly above normal.  Results for Victoria Kerr, Victoria Kerr (MRN [id number]) as of 05/11/2020 09:58  Ref. Range 04/01/2018 10:26  Hgb A Latest Ref Range: 96.4 - 98.8 % 63.0 (L)  Hemoglobin F Quantitation Latest Ref Range: 0.0 - 2.0 % 0.0  HGB S Latest Ref Range: 0.0 % 33.1 (H)  HGB C Latest Ref Range: 0.0 % 0.0  HGB VARIANT Latest Ref Range: 0.0 %  0.0  Sickle Solubility Test - HGBRFX Latest Ref Range: Negative  Positive (A)  Hemoglobin A2 Quantitation Latest Ref Range: 1.8 - 3.2 % 3.9 (H)  HGB INTERPRETATION Unknown Comment  These labs are consistent with sickle cell trait  I personally reviewed the ultrasound  performed on April 11, 2020 and concur with the radiology interpretation copied here: CLINICAL DATA:  Upper abdominal pain for 1 day.  EXAM: ULTRASOUND ABDOMEN LIMITED RIGHT UPPER QUADRANT  COMPARISON:  CT stone study earlier same day.  FINDINGS: Gallbladder:  Multiple gallstones evident, measuring up to 11 mm. No gallbladder wall thickening or pericholecystic fluid. Sonographer reports no sonographic Murphy sign.  Common bile duct:  Diameter: 5 mm  Liver:  No focal lesion identified. Within normal limits in parenchymal echogenicity. Portal vein is patent on color Doppler imaging with normal direction of blood flow towards the liver.  Other: None.  IMPRESSION: Cholelithiasis. No sonographic findings to suggest acute Cholecystitis.  I also reviewed the progress note from her emergency department visit wherein cardiac abnormalities were ruled out and she responded well to Toradol and hydration.  Assessment This is a 30 year old woman with abdominal pain and sonographic findings consistent with symptomatic cholelithiasis.  She is interested in cholecystectomy due to the increased frequency and severity of her symptoms.  Plan I have offered her a robot-assisted laparoscopic cholecystectomy.  I discussed the risks of the operation with her.  These include, but are not limited to, bleeding, infection, damage to surrounding tissues or structures, retained common bile duct stone, bile leak, need for additional interventions, surgeries, or procedures, need to convert to an open operation.  She had the opportunity to ask questions which were answered to her satisfaction.  We will get her scheduled for  surgery.    Duanne Guess 05/11/2020, 9:53 AM

## 2020-05-11 NOTE — Patient Instructions (Signed)
You have requested to have your gallbladder removed. This will be done at [facility] with Dr. Lady Gary.  You will most likely be out of work 1-2 weeks for this surgery. You will return after your post-op appointment with a lifting restriction for approximately 2 more weeks.  You will be able to eat anything you would like to following surgery. But, start by eating a bland diet and advance this as tolerated. The Gallbladder diet is below, please go as closely by this diet as possible prior to surgery to avoid any further attacks.  Please see the (blue)pre-care form that you have been given today. Our surgery scheduler will call you to look at surgery dates and go over information.   If you have any questions, please call our office.  Laparoscopic Cholecystectomy Laparoscopic cholecystectomy is surgery to remove the gallbladder. The gallbladder is located in the upper right part of the abdomen, behind the liver. It is a storage sac for bile, which is produced in the liver. Bile aids in the digestion and absorption of fats. Cholecystectomy is often done for inflammation of the gallbladder (cholecystitis). This condition is usually caused by a buildup of gallstones (cholelithiasis) in the gallbladder. Gallstones can block the flow of bile, and that can result in inflammation and pain. In severe cases, emergency surgery may be required. If emergency surgery is not required, you will have time to prepare for the procedure. Laparoscopic surgery is an alternative to open surgery. Laparoscopic surgery has a shorter recovery time. Your common bile duct may also need to be examined during the procedure. If stones are found in the common bile duct, they may be removed. LET [facility] CARE PROVIDER KNOW ABOUT:  Any allergies you have.  All medicines you are taking, including vitamins, herbs, eye drops, creams, and over-the-counter medicines.  Previous problems you or members of your family have had with  the use of anesthetics.  Any blood disorders you have.  Previous surgeries you have had.    Any medical conditions you have. RISKS AND COMPLICATIONS Generally, this is a safe procedure. However, problems may occur, including:  Infection.  Bleeding.  Allergic reactions to medicines.  Damage to other structures or organs.  A stone remaining in the common bile duct.  A bile leak from the cyst duct that is clipped when your gallbladder is removed.  The need to convert to open surgery, which requires a larger incision in the abdomen. This may be necessary if your surgeon thinks that it is not safe to continue with a laparoscopic procedure. BEFORE THE PROCEDURE  Ask your health care provider about:  Changing or stopping your regular medicines. This is especially important if you are taking diabetes medicines or blood thinners.  Taking medicines such as aspirin and ibuprofen. These medicines can thin your blood. Do not take these medicines before your procedure if your health care provider instructs you not to.  Follow instructions from your health care provider about eating or drinking restrictions.  Let your health care provider know if you develop a cold or an infection before surgery.  Plan to have someone take you home after the procedure.  Ask your health care provider how your surgical site will be marked or identified.  You may be given antibiotic medicine to help prevent infection. PROCEDURE  To reduce your risk of infection:  Your health care team will wash or sanitize their hands.  Your skin will be washed with soap.  An IV tube may be  inserted into one of your veins.  You will be given a medicine to make you fall asleep (general anesthetic).  A breathing tube will be placed in your mouth.  The surgeon will make several small cuts (incisions) in your abdomen.  A thin, lighted tube (laparoscope) that has a tiny camera on the end will be inserted through  one of the small incisions. The camera on the laparoscope will send a picture to a TV screen (monitor) in the operating room. This will give the surgeon a good view inside your abdomen.  A gas will be pumped into your abdomen. This will expand your abdomen to give the surgeon more room to perform the surgery.  Other tools that are needed for the procedure will be inserted through the other incisions. The gallbladder will be removed through one of the incisions.  After your gallbladder has been removed, the incisions will be closed with stitches (sutures), staples, or skin glue.  Your incisions may be covered with a bandage (dressing). The procedure may vary among health care providers and hospitals. AFTER THE PROCEDURE  Your blood pressure, heart rate, breathing rate, and blood oxygen level will be monitored often until the medicines you were given have worn off.  You will be given medicines as needed to control your pain.   This information is not intended to replace advice given to you by your health care provider. Make sure you discuss any questions you have with your health care provider.   Document Released: 04/03/2005 Document Revised: 12/23/2014 Document Reviewed: 11/13/2012 Elsevier Interactive Patient Education 2016 [place] Diet for Gallbladder Conditions A low-fat diet can be helpful if you have pancreatitis or a gallbladder condition. With these conditions, your pancreas and gallbladder have trouble digesting fats. A healthy eating plan with less fat will help rest your pancreas and gallbladder and reduce your symptoms. WHAT DO I NEED TO KNOW ABOUT THIS DIET?  Eat a low-fat diet.  Reduce your fat intake to less than 20-30% of your total daily calories. This is less than 50-60 g of fat per day.  Remember that you need some fat in your diet. Ask your dietician what your daily goal should be.  Choose nonfat and low-fat healthy foods. Look for the words  "nonfat," "low fat," or "fat free."  As a guide, look on the label and choose foods with less than 3 g of fat per serving. Eat only one serving.  Avoid alcohol.  Do not smoke. If you need help quitting, talk with your health care provider.  Eat small frequent meals instead of three large heavy meals. WHAT FOODS CAN I EAT? Grains Include healthy grains and starches such as potatoes, wheat bread, fiber-rich cereal, and brown rice. Choose whole grain options whenever possible. In adults, whole grains should account for 45-65% of your daily calories.  Fruits and Vegetables Eat plenty of fruits and vegetables. Fresh fruits and vegetables add fiber to your diet. Meats and Other Protein Sources Eat lean meat such as chicken and pork. Trim any fat off of meat before cooking it. Eggs, fish, and beans are other sources of protein. In adults, these foods should account for 10-35% of your daily calories. Dairy Choose low-fat milk and dairy options. Dairy includes fat and protein, as well as calcium.  Fats and Oils Limit high-fat foods such as fried foods, sweets, baked goods, sugary drinks.  Other Creamy sauces and condiments, such as mayonnaise, can add extra fat. Think about whether  or not you need to use them, or use smaller amounts or low fat options. WHAT FOODS ARE NOT RECOMMENDED?  High fat foods, such as:  [employer].  Ice cream.  [place] toast.  Sweet rolls.  Pizza.  Cheese bread.  Foods covered with batter, butter, creamy sauces, or cheese.  Fried foods.  Sugary drinks and desserts.  Foods that cause gas or bloating   This information is not intended to replace advice given to you by your health care provider. Make sure you discuss any questions you have with your health care provider.   Document Released: 04/08/2013 Document Reviewed: 04/08/2013 Elsevier Interactive Patient Education [employer].

## 2020-05-12 ENCOUNTER — Telehealth: Payer: Self-pay | Admitting: General Surgery

## 2020-05-12 NOTE — Telephone Encounter (Signed)
Patient has been advised of Pre-Admission date/time, COVID Testing date and Surgery date.  Surgery Date: 05/24/20 Preadmission Testing Date: 05/14/20 (phone 8a-1p) Covid Testing Date: 05/20/20 - patient [facility] ([address] [facility]) between 8a-1p  Patient has been made aware to call [phone], between 1-3:00pm the day before surgery, to find out what time to arrive for surgery.

## 2020-05-14 ENCOUNTER — Encounter
Admission: RE | Admit: 2020-05-14 | Discharge: 2020-05-14 | Disposition: A | Discharge status: Home / Self Care | Payer: BC Managed Care – PPO | Source: Ambulatory Visit | Attending: General Surgery | Admitting: General Surgery

## 2020-05-14 ENCOUNTER — Other Ambulatory Visit: Payer: Self-pay

## 2020-05-14 DIAGNOSIS — Z01818 Encounter for other preprocedural examination: Secondary | ICD-10-CM | POA: Diagnosis present

## 2020-05-14 HISTORY — DX: Headache, unspecified: R51.9

## 2020-05-14 NOTE — Patient Instructions (Signed)
Your procedure is scheduled on:05-24-20 MONDAY Report to the Registration Desk on the 1st floor of the [facility]-Then proceed to the 2nd floor Surgery Desk in the Medical [facility] To find out your arrival time, please call [phone] between 1PM - 3PM on:05-21-20 FRIDAY  REMEMBER: Instructions that are not followed completely may result in serious medical risk, up to and including death; or upon the discretion of your surgeon and anesthesiologist your surgery may need to be rescheduled.  Do not eat food after midnight the night before surgery.  No gum chewing, lozengers or hard candies.  You may however, drink CLEAR liquids up to 2 hours before you are scheduled to arrive for your surgery. Do not drink anything within 2 hours of your scheduled arrival time.  Clear liquids include: - water  - apple juice without pulp - gatorade (not RED) - black coffee or tea (Do NOT add milk or creamers to the coffee or tea) Do NOT drink anything that is not on this list.  TAKE THESE MEDICATIONS THE MORNING OF SURGERY WITH A SIP OF WATER: -NONE   One week prior to surgery: Stop Anti-inflammatories (NSAIDS) such as Advil, Aleve, Ibuprofen, Motrin, Naproxen, Naprosyn and Aspirin based products such as Excedrin, Goodys Powder, BC Powder-OK TO TAKE TYLENOL IF NEEDED  Stop ANY OVER THE COUNTER supplements until after surgery.  No Alcohol for 24 hours before or after surgery.  No Smoking including e-cigarettes for 24 hours prior to surgery.  No chewable tobacco products for at least 6 hours prior to surgery.  No nicotine patches on the day of surgery.  Do not use any "recreational" drugs for at least a week prior to your surgery.  Please be advised that the combination of cocaine and anesthesia may have negative outcomes, up to and including death. If you test positive for cocaine, your surgery will be cancelled.  On the morning of surgery brush your teeth with toothpaste and water, you may rinse your  mouth with mouthwash if you wish. Do not swallow any toothpaste or mouthwash.  Do not wear jewelry, make-up, hairpins, clips or nail polish.  Do not wear lotions, powders, or perfumes.   Do not shave body from the neck down 48 hours prior to surgery just in case you cut yourself which could leave a site for infection.  Also, freshly shaved skin may become irritated if using the CHG soap.  Contact lenses, hearing aids and dentures may not be worn into surgery.  Do not bring valuables to the hospital. [facility] is not responsible for any missing/lost belongings or valuables.   Use CHG Soap as directed on instruction sheet.  Notify your doctor if there is any change in your medical condition (cold, fever, infection).  Wear comfortable clothing (specific to your surgery type) to the hospital.  Plan for stool softeners for home use; pain medications have a tendency to cause constipation. You can also help prevent constipation by eating foods high in fiber such as fruits and vegetables and drinking plenty of fluids as your diet allows.  After surgery, you can help prevent lung complications by doing breathing exercises.  Take deep breaths and cough every 1-2 hours. Your doctor may order a device called an Incentive Spirometer to help you take deep breaths. When coughing or sneezing, hold a pillow firmly against your incision with both hands. This is called "splinting." Doing this helps protect your incision. It also decreases belly discomfort.  If you are being admitted to the  hospital overnight, leave your suitcase in the car. After surgery it may be brought to your room.  If you are being discharged the day of surgery, you will not be allowed to drive home. You will need a responsible adult (18 years or older) to drive you home and stay with you that night.   If you are taking public transportation, you will need to have a responsible adult (18 years or older) with you. Please confirm  with your physician that it is acceptable to use public transportation.   Please call the Pre-admissions Testing Dept. at [phone] if you have any questions about these instructions.  Visitation Policy:  Patients undergoing a surgery or procedure may have one family member or support person with them as long as that person is not COVID-19 positive or experiencing its symptoms.  That person may remain in the waiting area during the procedure.  Inpatient Visitation:    Visiting hours are 7 a.m. to 8 p.m. Patients will be allowed one visitor. The visitor may change daily. The visitor must pass COVID-19 screenings, use hand sanitizer when entering and exiting the patient's room and wear a mask at all times, including in the patient's room. Patients must also wear a mask when staff or their visitor are in the room. Masking is required regardless of vaccination status. Systemwide, no visitors 17 or younger.

## 2020-05-20 ENCOUNTER — Other Ambulatory Visit: Payer: Self-pay

## 2020-05-20 ENCOUNTER — Other Ambulatory Visit
Admission: RE | Admit: 2020-05-20 | Discharge: 2020-05-20 | Disposition: A | Discharge status: Home / Self Care | Payer: BC Managed Care – PPO | Source: Ambulatory Visit | Attending: General Surgery | Admitting: General Surgery

## 2020-05-20 DIAGNOSIS — Z01812 Encounter for preprocedural laboratory examination: Secondary | ICD-10-CM | POA: Diagnosis present

## 2020-05-20 DIAGNOSIS — Z20822 Contact with and (suspected) exposure to covid-19: Secondary | ICD-10-CM | POA: Diagnosis not present

## 2020-05-20 LAB — SARS CORONAVIRUS 2 (TAT 6-24 HRS): SARS Coronavirus 2: NEGATIVE

## 2020-05-23 MED ORDER — LACTATED RINGERS IV SOLN: INTRAVENOUS | Status: DC

## 2020-05-23 MED ORDER — CHLORHEXIDINE GLUCONATE 0.12 % MT SOLN: 15.0000 mL | Freq: Once | OROMUCOSAL | Status: AC

## 2020-05-23 MED ORDER — ORAL CARE MOUTH RINSE: 15.0000 mL | Freq: Once | OROMUCOSAL | Status: AC

## 2020-05-23 MED ORDER — FAMOTIDINE 20 MG PO TABS: 20.0000 mg | ORAL_TABLET | Freq: Once | ORAL | Status: AC

## 2020-05-24 ENCOUNTER — Ambulatory Visit: Payer: BC Managed Care – PPO | Admitting: Certified Registered Nurse Anesthetist

## 2020-05-24 ENCOUNTER — Encounter
Admission: RE | Disposition: A | Discharge status: Home / Self Care | Payer: Self-pay | Source: Home / Self Care | Attending: General Surgery

## 2020-05-24 ENCOUNTER — Ambulatory Visit
Admission: RE | Admit: 2020-05-24 | Discharge: 2020-05-24 | Disposition: A | Discharge status: Home / Self Care | Payer: BC Managed Care – PPO | Attending: General Surgery | Admitting: General Surgery

## 2020-05-24 ENCOUNTER — Other Ambulatory Visit: Payer: Self-pay

## 2020-05-24 ENCOUNTER — Encounter: Payer: Self-pay | Admitting: General Surgery

## 2020-05-24 DIAGNOSIS — K8020 Calculus of gallbladder without cholecystitis without obstruction: Secondary | ICD-10-CM

## 2020-05-24 DIAGNOSIS — Z880 Allergy status to penicillin: Secondary | ICD-10-CM | POA: Insufficient documentation

## 2020-05-24 DIAGNOSIS — K8010 Calculus of gallbladder with chronic cholecystitis without obstruction: Secondary | ICD-10-CM | POA: Diagnosis present

## 2020-05-24 LAB — POCT PREGNANCY, URINE: Preg Test, Ur: NEGATIVE

## 2020-05-24 SURGERY — CHOLECYSTECTOMY, ROBOT-ASSISTED, LAPAROSCOPIC
Anesthesia: General

## 2020-05-24 MED ORDER — FENTANYL CITRATE (PF) 100 MCG/2ML IJ SOLN
25.0000 ug | INTRAMUSCULAR | Status: DC | PRN
  Administered 2020-05-24 (×4): 25 ug via INTRAVENOUS

## 2020-05-24 MED ORDER — BUPIVACAINE HCL (PF) 0.25 % IJ SOLN
INTRAMUSCULAR | Status: AC
  Filled 2020-05-24: qty 30

## 2020-05-24 MED ORDER — MIDAZOLAM HCL 2 MG/2ML IJ SOLN
INTRAMUSCULAR | Status: AC
  Filled 2020-05-24: qty 2

## 2020-05-24 MED ORDER — CHLORHEXIDINE GLUCONATE 0.12 % MT SOLN
OROMUCOSAL | Status: AC
  Administered 2020-05-24: 15 mL via OROMUCOSAL
  Filled 2020-05-24: qty 15

## 2020-05-24 MED ORDER — CHLORHEXIDINE GLUCONATE CLOTH 2 % EX PADS: 6.0000 | MEDICATED_PAD | Freq: Once | CUTANEOUS | Status: DC

## 2020-05-24 MED ORDER — FENTANYL CITRATE (PF) 100 MCG/2ML IJ SOLN
INTRAMUSCULAR | Status: AC
  Filled 2020-05-24: qty 2

## 2020-05-24 MED ORDER — PHENYLEPHRINE HCL (PRESSORS) 10 MG/ML IV SOLN
INTRAVENOUS | Status: DC | PRN
  Administered 2020-05-24 (×2): 100 ug via INTRAVENOUS
  Administered 2020-05-24: 50 ug via INTRAVENOUS
  Administered 2020-05-24: 100 ug via INTRAVENOUS

## 2020-05-24 MED ORDER — FENTANYL CITRATE (PF) 100 MCG/2ML IJ SOLN
INTRAMUSCULAR | Status: AC
  Administered 2020-05-24: 25 ug via INTRAVENOUS
  Filled 2020-05-24: qty 2

## 2020-05-24 MED ORDER — CELECOXIB 200 MG PO CAPS
ORAL_CAPSULE | ORAL | Status: AC
  Administered 2020-05-24: 200 mg via ORAL
  Filled 2020-05-24: qty 1

## 2020-05-24 MED ORDER — SUGAMMADEX SODIUM 200 MG/2ML IV SOLN
INTRAVENOUS | Status: DC | PRN
  Administered 2020-05-24: 200 mg via INTRAVENOUS

## 2020-05-24 MED ORDER — LIDOCAINE HCL (PF) 2 % IJ SOLN
INTRAMUSCULAR | Status: AC
  Filled 2020-05-24: qty 5

## 2020-05-24 MED ORDER — GABAPENTIN 300 MG PO CAPS
ORAL_CAPSULE | ORAL | Status: AC
  Administered 2020-05-24: 300 mg via ORAL
  Filled 2020-05-24: qty 1

## 2020-05-24 MED ORDER — DEXAMETHASONE SODIUM PHOSPHATE 10 MG/ML IJ SOLN
INTRAMUSCULAR | Status: DC | PRN
  Administered 2020-05-24: 5 mg via INTRAVENOUS

## 2020-05-24 MED ORDER — ACETAMINOPHEN 500 MG PO TABS: 1000.0000 mg | ORAL_TABLET | ORAL | Status: AC

## 2020-05-24 MED ORDER — ROCURONIUM BROMIDE 100 MG/10ML IV SOLN
INTRAVENOUS | Status: DC | PRN
  Administered 2020-05-24: 60 mg via INTRAVENOUS
  Administered 2020-05-24: 10 mg via INTRAVENOUS
  Administered 2020-05-24: 30 mg via INTRAVENOUS

## 2020-05-24 MED ORDER — PHENYLEPHRINE HCL (PRESSORS) 10 MG/ML IV SOLN
INTRAVENOUS | Status: AC
  Filled 2020-05-24: qty 1

## 2020-05-24 MED ORDER — LIDOCAINE HCL (CARDIAC) PF 100 MG/5ML IV SOSY
PREFILLED_SYRINGE | INTRAVENOUS | Status: DC | PRN
  Administered 2020-05-24: 100 mg via INTRAVENOUS

## 2020-05-24 MED ORDER — ONDANSETRON HCL 4 MG/2ML IJ SOLN: 4.0000 mg | Freq: Once | INTRAMUSCULAR | Status: AC | PRN

## 2020-05-24 MED ORDER — SODIUM CHLORIDE 0.9 % IV SOLN
INTRAVENOUS | Status: AC
  Filled 2020-05-24: qty 2

## 2020-05-24 MED ORDER — ONDANSETRON HCL 4 MG/2ML IJ SOLN
INTRAMUSCULAR | Status: AC
  Filled 2020-05-24: qty 2

## 2020-05-24 MED ORDER — DEXMEDETOMIDINE (PRECEDEX) IN NS 20 MCG/5ML (4 MCG/ML) IV SYRINGE
PREFILLED_SYRINGE | INTRAVENOUS | Status: AC
  Filled 2020-05-24: qty 5

## 2020-05-24 MED ORDER — ACETAMINOPHEN 500 MG PO TABS
ORAL_TABLET | ORAL | Status: AC
  Administered 2020-05-24: 1000 mg via ORAL
  Filled 2020-05-24: qty 2

## 2020-05-24 MED ORDER — MIDAZOLAM HCL 2 MG/2ML IJ SOLN
INTRAMUSCULAR | Status: DC | PRN
  Administered 2020-05-24: 2 mg via INTRAVENOUS

## 2020-05-24 MED ORDER — PROPOFOL 10 MG/ML IV BOLUS
INTRAVENOUS | Status: AC
  Filled 2020-05-24: qty 20

## 2020-05-24 MED ORDER — ONDANSETRON HCL 4 MG/2ML IJ SOLN
INTRAMUSCULAR | Status: DC | PRN
  Administered 2020-05-24: 4 mg via INTRAVENOUS

## 2020-05-24 MED ORDER — SODIUM CHLORIDE 0.9 % IV SOLN
2.0000 g | INTRAVENOUS | Status: AC
  Administered 2020-05-24: 2 g via INTRAVENOUS

## 2020-05-24 MED ORDER — CHLORHEXIDINE GLUCONATE CLOTH 2 % EX PADS
6.0000 | MEDICATED_PAD | Freq: Once | CUTANEOUS | Status: AC
  Administered 2020-05-24: 6 via TOPICAL

## 2020-05-24 MED ORDER — IBUPROFEN 800 MG PO TABS: 800.0000 mg | ORAL_TABLET | Freq: Three times a day (TID) | ORAL | 0 refills | Status: DC | PRN

## 2020-05-24 MED ORDER — FENTANYL CITRATE (PF) 100 MCG/2ML IJ SOLN
INTRAMUSCULAR | Status: DC | PRN
  Administered 2020-05-24 (×2): 25 ug via INTRAVENOUS
  Administered 2020-05-24: 50 ug via INTRAVENOUS
  Administered 2020-05-24: 25 ug via INTRAVENOUS
  Administered 2020-05-24: 50 ug via INTRAVENOUS

## 2020-05-24 MED ORDER — HYDROCODONE-ACETAMINOPHEN 5-325 MG PO TABS: 1.0000 | ORAL_TABLET | Freq: Four times a day (QID) | ORAL | 0 refills | Status: DC | PRN

## 2020-05-24 MED ORDER — GABAPENTIN 300 MG PO CAPS: 300.0000 mg | ORAL_CAPSULE | ORAL | Status: AC

## 2020-05-24 MED ORDER — DEXMEDETOMIDINE (PRECEDEX) IN NS 20 MCG/5ML (4 MCG/ML) IV SYRINGE
PREFILLED_SYRINGE | INTRAVENOUS | Status: DC | PRN
  Administered 2020-05-24: 8 ug via INTRAVENOUS
  Administered 2020-05-24 (×3): 4 ug via INTRAVENOUS

## 2020-05-24 MED ORDER — LIDOCAINE-EPINEPHRINE 1 %-1:100000 IJ SOLN
INTRAMUSCULAR | Status: DC | PRN
  Administered 2020-05-24: 16 mL via SUBCUTANEOUS

## 2020-05-24 MED ORDER — FAMOTIDINE 20 MG PO TABS
ORAL_TABLET | ORAL | Status: AC
  Administered 2020-05-24: 20 mg via ORAL
  Filled 2020-05-24: qty 1

## 2020-05-24 MED ORDER — LIDOCAINE-EPINEPHRINE 1 %-1:100000 IJ SOLN
INTRAMUSCULAR | Status: AC
  Filled 2020-05-24: qty 1

## 2020-05-24 MED ORDER — INDOCYANINE GREEN 25 MG IV SOLR
7.5000 mg | Freq: Once | INTRAVENOUS | Status: AC
  Administered 2020-05-24: 7.5 mg via INTRAVENOUS
  Filled 2020-05-24: qty 3

## 2020-05-24 MED ORDER — PROPOFOL 10 MG/ML IV BOLUS
INTRAVENOUS | Status: DC | PRN
  Administered 2020-05-24: 150 mg via INTRAVENOUS

## 2020-05-24 MED ORDER — CELECOXIB 200 MG PO CAPS: 200.0000 mg | ORAL_CAPSULE | ORAL | Status: AC

## 2020-05-24 MED ORDER — ONDANSETRON HCL 4 MG/2ML IJ SOLN
INTRAMUSCULAR | Status: AC
  Administered 2020-05-24: 4 mg via INTRAVENOUS
  Filled 2020-05-24: qty 2

## 2020-05-24 SURGICAL SUPPLY — 57 items
BLADE SURG SZ11 CARB STEEL (BLADE) ×2 IMPLANT
CANISTER SUCT 1200ML W/VALVE (MISCELLANEOUS) IMPLANT
CANNULA REDUC XI 12-8 STAPL (CANNULA) ×1
CANNULA REDUCER 12-8 DVNC XI (CANNULA) ×1 IMPLANT
CHLORAPREP W/TINT 26 (MISCELLANEOUS) ×2 IMPLANT
CLIP VESOLOCK MED LG 6/CT (CLIP) ×2 IMPLANT
COVER TIP SHEARS 8 DVNC (MISCELLANEOUS) ×1 IMPLANT
COVER TIP SHEARS 8MM DA VINCI (MISCELLANEOUS) ×1
COVER WAND RF STERILE (DRAPES) ×2 IMPLANT
DECANTER SPIKE VIAL GLASS SM (MISCELLANEOUS) ×4 IMPLANT
DEFOGGER SCOPE WARMER CLEARIFY (MISCELLANEOUS) ×2 IMPLANT
DERMABOND ADVANCED (GAUZE/BANDAGES/DRESSINGS) ×1
DERMABOND ADVANCED .7 DNX12 (GAUZE/BANDAGES/DRESSINGS) ×1 IMPLANT
DRAPE ARM DVNC X/XI (DISPOSABLE) ×4 IMPLANT
DRAPE COLUMN DVNC XI (DISPOSABLE) ×1 IMPLANT
DRAPE DA VINCI XI ARM (DISPOSABLE) ×4
DRAPE DA VINCI XI COLUMN (DISPOSABLE) ×1
ELECT CAUTERY BLADE TIP 2.5 (TIP) ×2
ELECT REM PT RETURN 9FT ADLT (ELECTROSURGICAL) ×2
ELECTRODE CAUTERY BLDE TIP 2.5 (TIP) ×1 IMPLANT
ELECTRODE REM PT RTRN 9FT ADLT (ELECTROSURGICAL) ×1 IMPLANT
GLOVE INDICATOR 7.0 STRL GRN (GLOVE) IMPLANT
GLOVE SURG ENC MOIS LTX SZ6.5 (GLOVE) ×8 IMPLANT
GOWN STRL REUS W/ TWL LRG LVL3 (GOWN DISPOSABLE) ×3 IMPLANT
GOWN STRL REUS W/TWL LRG LVL3 (GOWN DISPOSABLE) ×3
GRASPER SUT TROCAR 14GX15 (MISCELLANEOUS) ×2 IMPLANT
IRRIGATOR SUCT 8 DISP DVNC XI (IRRIGATION / IRRIGATOR) IMPLANT
IRRIGATOR SUCTION 8MM XI DISP (IRRIGATION / IRRIGATOR)
IV NS 1000ML (IV SOLUTION)
IV NS 1000ML BAXH (IV SOLUTION) IMPLANT
KIT PINK PAD W/HEAD ARE REST (MISCELLANEOUS) ×2
KIT PINK PAD W/HEAD ARM REST (MISCELLANEOUS) ×1 IMPLANT
LABEL OR SOLS (LABEL) ×2 IMPLANT
MANIFOLD NEPTUNE II (INSTRUMENTS) ×2 IMPLANT
NEEDLE HYPO 22GX1.5 SAFETY (NEEDLE) ×2 IMPLANT
NEEDLE INSUFFLATION 14GA 120MM (NEEDLE) IMPLANT
NS IRRIG 500ML POUR BTL (IV SOLUTION) ×2 IMPLANT
OBTURATOR OPTICAL STANDARD 8MM (TROCAR) ×1
OBTURATOR OPTICAL STND 8 DVNC (TROCAR) ×1
OBTURATOR OPTICALSTD 8 DVNC (TROCAR) ×1 IMPLANT
PACK LAP CHOLECYSTECTOMY (MISCELLANEOUS) ×2 IMPLANT
PENCIL ELECTRO HAND CTR (MISCELLANEOUS) ×2 IMPLANT
POUCH SPECIMEN RETRIEVAL 10MM (ENDOMECHANICALS) ×2 IMPLANT
SEAL CANN UNIV 5-8 DVNC XI (MISCELLANEOUS) ×3 IMPLANT
SEAL XI 5MM-8MM UNIVERSAL (MISCELLANEOUS) ×3
SET TUBE SMOKE EVAC HIGH FLOW (TUBING) ×2 IMPLANT
SOLUTION ELECTROLUBE (MISCELLANEOUS) ×2 IMPLANT
STAPLER CANNULA SEAL DVNC XI (STAPLE) ×1 IMPLANT
STAPLER CANNULA SEAL XI (STAPLE) ×1
STRIP CLOSURE SKIN 1/2X4 (GAUZE/BANDAGES/DRESSINGS) ×2 IMPLANT
SUT MNCRL 4-0 (SUTURE) ×1
SUT MNCRL 4-0 27XMFL (SUTURE) ×1
SUT VIC AB 3-0 SH 27 (SUTURE) ×1
SUT VIC AB 3-0 SH 27X BRD (SUTURE) ×1 IMPLANT
SUT VICRYL 0 AB UR-6 (SUTURE) ×2 IMPLANT
SUTURE MNCRL 4-0 27XMF (SUTURE) ×1 IMPLANT
TROCAR XCEL NON-BLD 5MMX100MML (ENDOMECHANICALS) IMPLANT

## 2020-05-24 NOTE — Transfer of Care (Signed)
Immediate Anesthesia Transfer of Care Note  Patient: Victoria Kerr  Procedure(s) Performed: XI ROBOTIC ASSISTED LAPAROSCOPIC CHOLECYSTECTOMY (N/A )  Patient Location: PACU  Anesthesia Type:General  Level of Consciousness: drowsy  Airway & Oxygen Therapy: Patient Spontanous Breathing and Patient connected to face mask oxygen  Post-op Assessment: Report given to RN and Post -op Vital signs reviewed and stable  Post vital signs: Reviewed and stable  Last Vitals:  Vitals Value Taken Time  BP 126/88 05/24/20 1130  Temp 36.7 C 05/24/20 1130  Pulse 86 05/24/20 1132  Resp 20 05/24/20 1132  SpO2 97 % 05/24/20 1132  Vitals shown include unvalidated device data.  Last Pain:  Vitals:   05/24/20 0903  TempSrc: Tympanic  PainSc: 0-No pain         Complications: No complications documented.

## 2020-05-24 NOTE — Op Note (Signed)
Operative note  Pre-operative Diagnosis: Symptomatic cholelithiasis  Post-operative Diagnosis: Same  Procedure: Robot assisted laparoscopic cholecystectomy  Surgeon: Duanne Guess, MD  Anesthesia: GETA  Findings: Normal biliary anatomy  Estimated Blood Loss: Less than 2 cc       Specimens: Gallbladder           Complications: none immediately apparent  Procedure In Detail: The patient was seen again in the holding room. The benefits, complications, treatment options, and expected outcomes were discussed with the patient. The risks of bleeding, infection, recurrence of symptoms, failure to resolve symptoms, bile duct damage, bile duct leak, retained common bile duct stone, bowel injury, any of which could require further surgery and/or ERCP, stent, or papillotomy were reviewed with the patient. The likelihood of improving the patient's symptoms with return to their baseline status is good.  The patient and/or family concurred with the proposed plan, giving informed consent.  The patient was taken to operating room, identified and the procedure verified as laparoscopic cholecystectomy.  A time out was held and the above information confirmed.  Prior to the induction of general anesthesia, antibiotic prophylaxis was administered. VTE prophylaxis was in place. General endotracheal anesthesia was then administered and tolerated well. After the induction, the abdomen was prepped with Chloraprep and draped in the sterile fashion. The patient was positioned in the supine position.  Optiview technique was used to enter the abdomen in the right upper quadrant via a standard 5 mm laparoscopic trocar.  Pneumoperitoneum was then created with CO2 and tolerated well without any adverse changes in the patient's vital signs.  A 12 mm robotic trocar along with three 8-mm robotic trochars were placed under direct vision.  All skin incisions were infiltrated with a local anesthetic agent before making the  incision and placing the trocars.   The patient was positioned in 15 degrees of reverse Trendelenburg and tilted 10 degrees to the left.  The robot was brought to the surgical field and docked in the standard fashion.  We made sure all the instrumentation was kept in direct view at all times and that there were no collision between the arms.  I scrubbed out and went to the console.  The gallbladder was identified, the fundus grasped and retracted cephalad. Adhesions were lysed bluntly. The infundibulum was grasped and retracted laterally, exposing the peritoneum overlying the triangle of Calot. This was then divided and exposed in a blunt fashion. An extended critical view of the cystic duct and cystic artery was obtained.  The cystic duct was clearly identified and bluntly dissected away from the surrounding tissues, as was the cystic artery.  Using ICG cholangiography we visualized the cystic duct and confirmed that there was no aberrant biliary ductal anatomy nor any evidence of bile duct injury.  Both cystic duct and cystic artery were clipped and divided. The gallbladder was taken from the gallbladder fossa in a retrograde fashion with the electrocautery. Hemostasis was achieved with the electrocautery. Inspection of the right upper quadrant was performed. No bleeding, bile duct injury or leak, or bowel injury was noted.  The gallbladder was then placed in an Endopouch bag. The robotic instruments were removed and robotic arms were undocked in the standard fashion.    I scrubbed back in.  The gallbladder was removed via the 12 mm trocar site.  Due to the degree of offset between the fascial layers (trocar had been skived in at the beginning of the case), I felt that the 12 mm trocar site was  highly unlikely to develop a hernia and therefore I did not close the fascia.  The remaining 8 mm ports were removed and pneumoperitoneum was released.  Each port site was closed with deep dermal 3-0 Vicryl.  4-0  subcuticular Monocryl was used to close the skin. Dermabond was applied, followed by Steri-Strips.  The patient was then awakened, extubated, and taken to the postanesthesia recovery unit in stable condition.   Sponge, lap, and needle counts were reported to be correct number at closure and at the conclusion of the case.          Duanne Guess, MD FACS

## 2020-05-24 NOTE — Interval H&P Note (Signed)
History and Physical Interval Note:  05/24/2020 9:21 AM  Victoria Kerr  has presented today for surgery, with the diagnosis of symptomatic cholelithiasis.  The various methods of treatment have been discussed with the patient and family. After consideration of risks, benefits and other options for treatment, the patient has consented to  Procedure(s) with comments: XI ROBOTIC ASSISTED LAPAROSCOPIC CHOLECYSTECTOMY (N/A) - Provider requesting 3 hours /180 minutes for procedure. as a surgical intervention.  The patient's history has been reviewed, patient examined, no change in status, stable for surgery.  I have reviewed the patient's chart and labs.  Questions were answered to the patient's satisfaction.     Duanne Guess

## 2020-05-24 NOTE — Anesthesia Postprocedure Evaluation (Signed)
Anesthesia Post Note  Patient: Victoria Kerr  Procedure(s) Performed: XI ROBOTIC ASSISTED LAPAROSCOPIC CHOLECYSTECTOMY (N/A )  Patient location during evaluation: PACU Anesthesia Type: General Level of consciousness: awake and alert Pain management: pain level controlled Vital Signs Assessment: post-procedure vital signs reviewed and stable Respiratory status: spontaneous breathing and respiratory function stable Cardiovascular status: stable Anesthetic complications: no   No complications documented.   Last Vitals:  Vitals:   05/24/20 0903 05/24/20 1130  BP: (!) 134/91 126/88  Pulse: 96 92  Resp: 20 16  Temp: 36.6 C 36.7 C  SpO2: 100% 100%    Last Pain:  Vitals:   05/24/20 0903  TempSrc: Tympanic  PainSc: 0-No pain                 Victoria Kerr

## 2020-05-24 NOTE — Anesthesia Preprocedure Evaluation (Signed)
Anesthesia Evaluation  Patient identified by MRN, date of birth, ID band Patient awake    Reviewed: Allergy & Precautions, NPO status , Patient's Chart, lab work & pertinent test results  History of Anesthesia Complications Negative for: history of anesthetic complications  Airway Mallampati: I       Dental   Pulmonary neg sleep apnea, neg COPD, Not current smoker,           Cardiovascular (-) hypertension(-) Past MI and (-) CHF (-) dysrhythmias (-) Valvular Problems/Murmurs     Neuro/Psych neg Seizures    GI/Hepatic Neg liver ROS, neg GERD  ,  Endo/Other  neg diabetes  Renal/GU negative Renal ROS     Musculoskeletal   Abdominal   Peds  Hematology   Anesthesia Other Findings   Reproductive/Obstetrics                             Anesthesia Physical Anesthesia Plan  ASA: I  Anesthesia Plan: General   Post-op Pain Management:    Induction: Intravenous  PONV Risk Score and Plan: 3 and Ondansetron, Aprepitant and Midazolam  Airway Management Planned: Oral ETT  Additional Equipment:   Intra-op Plan:   Post-operative Plan:   Informed Consent: I have reviewed the patients History and Physical, chart, labs and discussed the procedure including the risks, benefits and alternatives for the proposed anesthesia with the patient or authorized representative who has indicated his/her understanding and acceptance.       Plan Discussed with:   Anesthesia Plan Comments:         Anesthesia Quick Evaluation

## 2020-05-24 NOTE — Anesthesia Procedure Notes (Signed)
Procedure Name: Intubation Date/Time: 05/24/2020 9:56 AM Performed by: Hezzie Bump, CRNA Pre-anesthesia Checklist: Patient identified, Emergency Drugs available, Suction available and Patient being monitored Patient Re-evaluated:Patient Re-evaluated prior to induction Oxygen Delivery Method: Circle system utilized Preoxygenation: Pre-oxygenation with 100% oxygen Induction Type: IV induction Ventilation: Mask ventilation without difficulty Laryngoscope Size: McGraph Grade View: Grade I Tube type: Oral Tube size: 7.0 mm Number of attempts: 1 Airway Equipment and Method: Stylet and Oral airway Placement Confirmation: ETT inserted through vocal cords under direct vision,  positive ETCO2 and breath sounds checked- equal and bilateral Secured at: 20 cm Tube secured with: Tape Dental Injury: Teeth and Oropharynx as per pre-operative assessment  Comments: Performed by Randon Goldsmith, SRNA under direct supervision by Dr. Henrene Hawking and Ronnald Ramp, CRNA

## 2020-05-24 NOTE — Discharge Instructions (Signed)

## 2020-05-25 LAB — SURGICAL PATHOLOGY

## 2020-06-01 ENCOUNTER — Other Ambulatory Visit: Payer: Self-pay

## 2020-06-01 MED ORDER — IBUPROFEN 800 MG PO TABS: 800.0000 mg | ORAL_TABLET | Freq: Three times a day (TID) | ORAL | 0 refills | Status: DC | PRN

## 2020-06-10 ENCOUNTER — Ambulatory Visit (INDEPENDENT_AMBULATORY_CARE_PROVIDER_SITE_OTHER): Payer: BC Managed Care – PPO | Admitting: Physician Assistant

## 2020-06-10 ENCOUNTER — Encounter: Payer: Self-pay | Admitting: Physician Assistant

## 2020-06-10 ENCOUNTER — Other Ambulatory Visit: Payer: Self-pay

## 2020-06-10 VITALS — BP 136/91 | HR 80 | Temp 98.4°F | Ht 69.0 in | Wt 220.4 lb

## 2020-06-10 DIAGNOSIS — Z09 Encounter for follow-up examination after completed treatment for conditions other than malignant neoplasm: Secondary | ICD-10-CM

## 2020-06-10 DIAGNOSIS — K8020 Calculus of gallbladder without cholecystitis without obstruction: Secondary | ICD-10-CM

## 2020-06-10 NOTE — Progress Notes (Signed)
[facility] POST-OP OFFICE VISIT  06/10/2020  HPI: Victoria Kerr is a 30 y.o. female 17 days s/p robotic assisted laparoscopic cholecystectomy for symptomatic cholelithiasis with Dr Lady Gary.   She is overall doing well No abdominal pain, fever, chills, nausea, emesis, or bowel changes She is tolerating PO without issues She only complains of stitch hanging out of her RLQ wound No other issues  Vital signs: BP (!) 136/91   Pulse 80   Temp 98.4 F (36.9 C) (Oral)   Ht 5\' 9"  (1.753 m)   Wt 220 lb 6.4 oz (100 kg)   LMP 06/06/2020   SpO2 99%   BMI 32.55 kg/m    Physical Exam: Constitutional: Well appearing female, NAD Abdomen: Soft, non-tender, non-distended, no rebound/guarding Skin: Laparoscopic incisions are well healed, no erythema or drainage, there was a Monocryl suture protruding from RLQ incision which I removed.   Assessment/Plan: This is a 30 y.o. female 17 days s/p robotic assisted laparoscopic cholecystectomy for symptomatic cholelithiasis with Dr 26.    - Pain control prn  - Reviewed lifting restrictions  - Reviewed wound care; suture removed   - Reviewed surgical pathology: CCC, negative for malignancy   - She will rtc on as needed basis  -- Lady Gary, PA-C [facility] 06/10/2020, 10:07 AM [phone] M-F: 7am - 4pm

## 2020-06-10 NOTE — Patient Instructions (Addendum)
If you have any concerns or questions, please feel free to call our office.     GENERAL POST-OPERATIVE PATIENT INSTRUCTIONS   WOUND CARE INSTRUCTIONS:  Keep a dry clean dressing on the wound if there is drainage. The initial bandage may be removed after 24 hours.  Once the wound has quit draining you may leave it open to air.  If clothing rubs against the wound or causes irritation and the wound is not draining you may cover it with a dry dressing during the daytime.  Try to keep the wound dry and avoid ointments on the wound unless directed to do so.  If the wound becomes bright red and painful or starts to drain infected material that is not clear, please contact your physician immediately.  If the wound is mildly pink and has a thick firm ridge underneath it, this is normal, and is referred to as a healing ridge.  This will resolve over the next 4-6 weeks.  BATHING: You may shower if you have been informed of this by your surgeon. However, Please do not submerge in a tub, hot tub, or pool until incisions are completely sealed or have been told by your surgeon that you may do so.  DIET:  You may eat any foods that you can tolerate.  It is a good idea to eat a high fiber diet and take in plenty of fluids to prevent constipation.  If you do become constipated you may want to take a mild laxative or take ducolax tablets on a daily basis until your bowel habits are regular.  Constipation can be very uncomfortable, along with straining, after recent surgery.  ACTIVITY:  You are encouraged to cough and deep breath or use your incentive spirometer if you were given one, every 15-30 minutes when awake.  This will help prevent respiratory complications and low grade fevers post-operatively if you had a general anesthetic.  You may want to hug a pillow when coughing and sneezing to add additional support to the surgical area, if you had abdominal or chest surgery, which will decrease pain during these times.   You are encouraged to walk and engage in light activity for the next two weeks.  You should not lift more than 15 pounds, until 06/28/2020 as it could put you at increased risk for complications.  Twenty pounds is roughly equivalent to a plastic bag of groceries. At that time- Listen to your body when lifting, if you have pain when lifting, stop and then try again in a few days. Soreness after doing exercises or activities of daily living is normal as you get back in to your normal routine.  MEDICATIONS:  Try to take narcotic medications and anti-inflammatory medications, such as tylenol, ibuprofen, naprosyn, etc., with food.  This will minimize stomach upset from the medication.  Should you develop nausea and vomiting from the pain medication, or develop a rash, please discontinue the medication and contact your physician.  You should not drive, make important decisions, or operate machinery when taking narcotic pain medication.  SUNBLOCK Use sun block to incision area over the next year if this area will be exposed to sun. This helps decrease scarring and will allow you avoid a permanent darkened area over your incision.

## 2020-10-21 ENCOUNTER — Other Ambulatory Visit: Payer: Self-pay

## 2020-10-21 ENCOUNTER — Ambulatory Visit
Admission: EM | Admit: 2020-10-21 | Discharge: 2020-10-21 | Disposition: A | Discharge status: Home / Self Care | Payer: BC Managed Care – PPO | Attending: Emergency Medicine | Admitting: Emergency Medicine

## 2020-10-21 DIAGNOSIS — R030 Elevated blood-pressure reading, without diagnosis of hypertension: Secondary | ICD-10-CM | POA: Diagnosis not present

## 2020-10-21 DIAGNOSIS — R52 Pain, unspecified: Secondary | ICD-10-CM | POA: Diagnosis not present

## 2020-10-21 DIAGNOSIS — U071 COVID-19: Secondary | ICD-10-CM | POA: Diagnosis not present

## 2020-10-21 DIAGNOSIS — G4489 Other headache syndrome: Secondary | ICD-10-CM

## 2020-10-21 LAB — RESP PANEL BY RT-PCR (FLU A&B, COVID) ARPGX2
Influenza A by PCR: NEGATIVE
Influenza B by PCR: NEGATIVE
SARS Coronavirus 2 by RT PCR: POSITIVE — AB

## 2020-10-21 MED ORDER — NIRMATRELVIR/RITONAVIR (PAXLOVID)TABLET: 3.0000 | ORAL_TABLET | Freq: Two times a day (BID) | ORAL | 0 refills | Status: AC

## 2020-10-21 NOTE — ED Triage Notes (Signed)
Patient complains of headache, fatigue, body aches, nasal congestion x yesterday.

## 2020-10-21 NOTE — Discharge Instructions (Addendum)
Recommend start Paxlovid twice a day as directed for 5 days. May take Ibuprofen 600mg  or Tylenol 1000mg  every 8 hours as needed for headache, body aches or fever. Rest. Continue to push fluids to stay hydrated. May take OTC Delsym as directed if needed for cough. Follow-up in 4 to 5 days if minimal improvement or sooner if worsening.

## 2020-10-21 NOTE — ED Provider Notes (Signed)
[facility]    CSN: [id number] Arrival date & time: 10/21/20  1115      History   Chief Complaint Chief Complaint  Patient presents with   Headache    HPI Victoria Kerr is a 30 y.o. female.   30 year old female presents with headache that started yesterday morning. Also having body aches, weakness, fatigue, chills, and scratchy throat. Denies any runny nose, cough or GI symptoms. Has taken Tylenol, BC powder and hydrocodone with no relief. No history of HTN but blood pressure elevated today with rapid pulse. No known exposure to COVID 19. Has not been vaccinated against COVID. No other chronic health issues except migraine headaches. This headache is different from her usual migraine and does not respond to medication. No current daily medication.   The history is provided by the patient.   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 Authorizing Provider  nirmatrelvir/ritonavir EUA (PAXLOVID) TABS Take 3 tablets by mouth 2 (two) times daily for 5 days. Patient GFR is >60. Take nirmatrelvir (150 mg) two tablets twice daily for 5 days and ritonavir (100 mg) one tablet twice daily for 5 days. 10/21/20 10/26/20 Yes Chaylee Ehrsam, Ali Lowe, NP    Family History Family History  Problem Relation Age of Onset   Heart attack Mother     Social History Social History   Tobacco Use   Smoking status: Never   Smokeless tobacco:  Never  Vaping Use   Vaping Use: Never used  Substance Use Topics   Alcohol use: Yes    Comment: OCC   Drug use: Never     Allergies   Penicillins and Wound dressing adhesive   Review of Systems Review of Systems  Constitutional:  Positive for activity change, appetite change, chills, fatigue and fever. Negative for diaphoresis.  HENT:  Positive for sore throat (scratchy). Negative for congestion, ear discharge, ear pain, facial swelling, mouth sores, nosebleeds, postnasal drip, rhinorrhea, sinus pressure, sinus pain and trouble swallowing.   Eyes:  Negative for pain, discharge, redness and itching.  Respiratory:  Negative for cough, chest tightness, shortness of breath and wheezing.   Cardiovascular:  Negative for chest pain.  Gastrointestinal:  Negative for diarrhea, nausea and vomiting.  Musculoskeletal:  Positive for arthralgias and myalgias. Negative for neck pain and neck stiffness.  Skin:  Negative for color change and rash.  Allergic/Immunologic: Negative for environmental allergies, food allergies and immunocompromised state.  Neurological:  Positive for weakness and headaches. Negative for dizziness, tremors, seizures, syncope, facial asymmetry, speech difficulty, light-headedness and numbness.  Hematological:  Negative for adenopathy. Does not bruise/bleed easily.    Physical Exam Triage Vital Signs ED Triage Vitals  Enc Vitals Group     BP 10/21/20 1307 (!) 134/100     Pulse Rate 10/21/20 1307 98     Resp 10/21/20 1307 18     Temp 10/21/20 1307 99.5 F (37.5 C)     Temp Source 10/21/20 1307 Oral     SpO2 10/21/20 1307 98 %     Weight 10/21/20 1306 234 lb (106.1 kg)     Height 10/21/20 1306 5\' 9"  (1.753 m)     Head Circumference --      Peak Flow --      Pain Score 10/21/20 1304 10     Pain Loc --      Pain Edu? --      Excl. in GC? --    No data found.  Updated Vital Signs BP (!) 134/100 (BP Location: Right Arm)   Pulse 98   Temp 99.5 F (37.5 C)  (Oral)   Resp 18   Ht 5\' 9"  (1.753 m)   Wt 234 lb (106.1 kg)   LMP 10/18/2020   SpO2 98%   Breastfeeding No   BMI 34.56 kg/m   Visual Acuity Right Eye Distance:   Left Eye Distance:   Bilateral Distance:    Right Eye Near:   Left Eye Near:    Bilateral Near:     Physical Exam Vitals and nursing note reviewed.  Constitutional:      General: She is awake. She is not in acute distress.    Appearance: She is well-developed and well-groomed. She is ill-appearing.     Comments: She is sitting on the exam table in no acute distress but appears tired and ill.   HENT:     Head: Normocephalic and atraumatic.     Right Ear: Hearing, tympanic membrane, ear canal and external ear normal.     Left Ear: Hearing, tympanic membrane, ear canal and external ear normal.     Nose: Nose normal. No congestion or rhinorrhea.     Right Sinus: No maxillary sinus tenderness or frontal sinus tenderness.     Left Sinus: No maxillary sinus tenderness or frontal sinus tenderness.     Mouth/Throat:     Lips: Pink.     Mouth: Mucous membranes are moist.     Pharynx: Oropharynx is clear. Uvula midline. Posterior oropharyngeal erythema present. No pharyngeal swelling, oropharyngeal exudate or uvula swelling.  Eyes:     Extraocular Movements: Extraocular movements intact.     Conjunctiva/sclera: Conjunctivae normal.  Cardiovascular:     Rate and Rhythm: Regular rhythm. Tachycardia present.     Heart sounds: Normal heart sounds. No murmur heard. Pulmonary:     Effort: Pulmonary effort is normal. No prolonged expiration or respiratory distress.     Breath sounds: Normal breath sounds and air entry. No decreased air movement. No decreased breath sounds, wheezing, rhonchi or rales.  Musculoskeletal:        General: Normal range of motion.     Cervical back: Normal range of motion and neck supple.  Lymphadenopathy:     Cervical: No cervical adenopathy.  Skin:    General: Skin is warm and dry.      Capillary Refill: Capillary refill takes less than 2 seconds.     Findings: No rash.  Neurological:     General: No focal deficit present.     Mental Status: She is alert  and oriented to person, place, and time.  Psychiatric:        Mood and Affect: Mood normal.        Behavior: Behavior normal. Behavior is cooperative.        Thought Content: Thought content normal.        Judgment: Judgment normal.     UC Treatments / Results  Labs (all labs ordered are listed, but only abnormal results are displayed) Labs Reviewed  RESP PANEL BY RT-PCR (FLU A&B, COVID) ARPGX2 - Abnormal; Notable for the following components:      Result Value   SARS Coronavirus 2 by RT PCR POSITIVE (*)    All other components within normal limits    EKG   Radiology No results found.  Procedures Procedures (including critical care time)  Medications Ordered in UC Medications - No data to display  Initial Impression / Assessment and Plan / UC Course  I have reviewed the triage vital signs and the nursing notes.  Pertinent labs & imaging results that were available during my care of the patient were reviewed by me and considered in my medical decision making (see chart for details).     Reviewed positive rapid COVID 19 test result with patient. Negative for Influenza. Reviewed clinical symptoms of infection and infectious nature of illness and quarantine period. Discussed wearing mask around 55 year old son. Since patient is overweight with tachycardia and elevated blood pressure, she desires oral anti-viral treatment for COVID 19. Since she had surgery for gallbladder removal in February 2022 and had labwork around the time of her surgery that demonstrated normal kidney function (GFR>60) and she is not on any medications, will treat with Paxlovid twice a day as directed for 5 days. No history of liver disease- previous labwork did show elevation of LFT's when she was symptomatic with gall bladder disease but  has resolved since removal of gallbladder. May take Ibuprofen 600mg  or Tylenol 1000mg  every 8 hours as needed for headache and fever. Rest. Continue to push fluids to stay hydrated. Discussed use of OTC Delsym for cough if it develops. Note written for work. Follow-up in 4 to 5 days if minimal improvement or sooner if any difficulty breathing, shortness of breath or chest pain develop.    Final Clinical Impressions(s) / UC Diagnoses   Final diagnoses:  COVID-19 virus infection  Other headache syndrome  Generalized body aches  Elevated blood-pressure reading without diagnosis of hypertension     Discharge Instructions      Recommend start Paxlovid twice a day as directed for 5 days. May take Ibuprofen 600mg  or Tylenol 1000mg  every 8 hours as needed for headache, body aches or fever. Rest. Continue to push fluids to stay hydrated. May take OTC Delsym as directed if needed for cough. Follow-up in 4 to 5 days if minimal improvement or sooner if worsening.      ED Prescriptions     Medication Sig Dispense Auth. Provider   nirmatrelvir/ritonavir EUA (PAXLOVID) TABS Take 3 tablets by mouth 2 (two) times daily for 5 days. Patient GFR is >60. Take nirmatrelvir (150 mg) two tablets twice daily for 5 days and ritonavir (100 mg) one tablet twice daily for 5 days. 30 tablet Yona Kosek, , NP      PDMP not reviewed this encounter.   , NP 10/22/20 1413

## 2021-01-04 ENCOUNTER — Encounter: Payer: Self-pay | Admitting: General Surgery

## 2021-10-10 DIAGNOSIS — E282 Polycystic ovarian syndrome: Secondary | ICD-10-CM | POA: Insufficient documentation

## 2022-08-18 DIAGNOSIS — I10 Essential (primary) hypertension: Secondary | ICD-10-CM | POA: Insufficient documentation

## 2022-09-07 ENCOUNTER — Emergency Department
Admission: EM | Admit: 2022-09-07 | Discharge: 2022-09-07 | Disposition: A | Discharge status: Home / Self Care | Payer: BC Managed Care – PPO | Attending: Emergency Medicine | Admitting: Emergency Medicine

## 2022-09-07 ENCOUNTER — Emergency Department: Payer: BC Managed Care – PPO

## 2022-09-07 ENCOUNTER — Other Ambulatory Visit: Payer: Self-pay

## 2022-09-07 DIAGNOSIS — I10 Essential (primary) hypertension: Secondary | ICD-10-CM | POA: Insufficient documentation

## 2022-09-07 DIAGNOSIS — Z20822 Contact with and (suspected) exposure to covid-19: Secondary | ICD-10-CM | POA: Diagnosis not present

## 2022-09-07 DIAGNOSIS — R0789 Other chest pain: Secondary | ICD-10-CM | POA: Insufficient documentation

## 2022-09-07 DIAGNOSIS — R5381 Other malaise: Secondary | ICD-10-CM | POA: Insufficient documentation

## 2022-09-07 DIAGNOSIS — R079 Chest pain, unspecified: Secondary | ICD-10-CM

## 2022-09-07 DIAGNOSIS — R059 Cough, unspecified: Secondary | ICD-10-CM | POA: Diagnosis not present

## 2022-09-07 LAB — CBC
HCT: 38.3 % (ref 36.0–46.0)
Hemoglobin: 12.5 g/dL (ref 12.0–15.0)
MCH: 26.4 pg (ref 26.0–34.0)
MCHC: 32.6 g/dL (ref 30.0–36.0)
MCV: 80.8 fL (ref 80.0–100.0)
Platelets: 393 10*3/uL (ref 150–400)
RBC: 4.74 MIL/uL (ref 3.87–5.11)
RDW: 13.8 % (ref 11.5–15.5)
WBC: 7.3 10*3/uL (ref 4.0–10.5)
nRBC: 0 % (ref 0.0–0.2)

## 2022-09-07 LAB — BASIC METABOLIC PANEL
Anion gap: 10 (ref 5–15)
BUN: 9 mg/dL (ref 6–20)
CO2: 24 mmol/L (ref 22–32)
Calcium: 9.1 mg/dL (ref 8.9–10.3)
Chloride: 102 mmol/L (ref 98–111)
Creatinine, Ser: 0.88 mg/dL (ref 0.44–1.00)
GFR, Estimated: >60 mL/min (ref >60)
Glucose, Bld: 152 mg/dL — ABNORMAL HIGH (ref 70–99)
Potassium: 3.4 mmol/L — ABNORMAL LOW (ref 3.5–5.1)
Sodium: 136 mmol/L (ref 135–145)

## 2022-09-07 LAB — RESP PANEL BY RT-PCR (RSV, FLU A&B, COVID)  RVPGX2
Influenza A by PCR: NEGATIVE
Influenza B by PCR: NEGATIVE
Resp Syncytial Virus by PCR: NEGATIVE
SARS Coronavirus 2 by RT PCR: NEGATIVE

## 2022-09-07 LAB — TROPONIN I (HIGH SENSITIVITY)
Troponin I (High Sensitivity): 3 ng/L (ref <18)
Troponin I (High Sensitivity): <2 ng/L (ref <18)

## 2022-09-07 LAB — D-DIMER, QUANTITATIVE: D-Dimer, Quant: 0.52 ug/mL-FEU — ABNORMAL HIGH (ref 0.00–0.50)

## 2022-09-07 LAB — POC URINE PREG, ED: Preg Test, Ur: NEGATIVE

## 2022-09-07 MED ORDER — IOHEXOL 350 MG/ML SOLN
100.0000 mL | Freq: Once | INTRAVENOUS | Status: AC | PRN
  Administered 2022-09-07: 100 mL via INTRAVENOUS

## 2022-09-07 MED ORDER — LACTATED RINGERS IV BOLUS
1000.0000 mL | Freq: Once | INTRAVENOUS | Status: AC
  Administered 2022-09-07: 1000 mL via INTRAVENOUS

## 2022-09-07 NOTE — ED Triage Notes (Signed)
Pt to ED for left sided chest pain started today. Right sided jaw pain started on Tuesday. Left arm pain started on Monday. +shob. +recent URI

## 2022-09-07 NOTE — ED Provider Notes (Signed)
[facility] Provider Note    Event Date/Time   First MD Initiated Contact with Patient 09/07/22 1425     (approximate)   History   Chief Complaint Chest Pain   HPI  Victoria Kerr is a 32 y.o. female with past medical history of hypertension and PCOS who presents to the ED complaining of chest pain.  Patient reports that she woke up this morning with pressure underneath her left breast that has been present constantly since then.  She reports some mild difficulty breathing, has had a nonproductive cough for the past couple of days.  She reports feeling ill with congestion and malaise for the past few days as well, states her son recently brought an illness home from school.  She had some left arm discomfort a few days ago that resolved as well as some right-sided jaw pain 2 days ago that also resolved.  She has not noticed any pain or swelling in her legs.     Physical Exam   Triage Vital Signs: ED Triage Vitals  Enc Vitals Group     BP 09/07/22 1400 130/88     Pulse Rate 09/07/22 1400 (!) 106     Resp 09/07/22 1400 18     Temp 09/07/22 1400 97.9 F (36.6 C)     Temp src --      SpO2 09/07/22 1400 100 %     Weight 09/07/22 1401 215 lb (97.5 kg)     Height 09/07/22 1401 5\' 9"  (1.753 m)     Head Circumference --      Peak Flow --      Pain Score 09/07/22 1400 2     Pain Loc --      Pain Edu? --      Excl. in GC? --     Most recent vital signs: Vitals:   09/07/22 1400 09/07/22 1407  BP: 130/88   Pulse: (!) 106 (!) 120  Resp: 18   Temp: 97.9 F (36.6 C)   SpO2: 100%     Constitutional: Alert and oriented. Eyes: Conjunctivae are normal. Head: Atraumatic. Nose: No congestion/rhinnorhea. Mouth/Throat: Mucous membranes are moist.  Cardiovascular: Tachycardic, regular rhythm. Grossly normal heart sounds.  2+ radial pulses bilaterally. Respiratory: Normal respiratory effort.  No retractions. Lungs CTAB.  No chest wall tenderness to  palpation. Gastrointestinal: Soft and nontender. No distention. Musculoskeletal: No lower extremity tenderness nor edema.  Neurologic:  Normal speech and language. No gross focal neurologic deficits are appreciated.    ED Results / Procedures / Treatments   Labs (all labs ordered are listed, but only abnormal results are displayed) Labs Reviewed  BASIC METABOLIC PANEL - Abnormal; Notable for the following components:      Result Value   Potassium 3.4 (*)    Glucose, Bld 152 (*)    All other components within normal limits  D-DIMER, QUANTITATIVE - Abnormal; Notable for the following components:   D-Dimer, Quant 0.52 (*)    All other components within normal limits  RESP PANEL BY RT-PCR (RSV, FLU A&B, COVID)  RVPGX2  CBC  POC URINE PREG, ED  TROPONIN I (HIGH SENSITIVITY)  TROPONIN I (HIGH SENSITIVITY)     EKG  ED ECG REPORT I, Chesley Noon, the attending physician, personally viewed and interpreted this ECG.   Date: 09/07/2022  EKG Time: 14:06  Rate: 128  Rhythm: sinus tachycardia  Axis: Normal  Intervals:none  ST&T Change: None  RADIOLOGY Chest x-ray reviewed and interpreted  by me with no infiltrate, edema, or effusion.  PROCEDURES:  Critical Care performed: No  Procedures   MEDICATIONS ORDERED IN ED: Medications  lactated ringers bolus 1,000 mL (0 mLs Intravenous Stopped 09/07/22 1615)  iohexol (OMNIPAQUE) 350 MG/ML injection 100 mL (100 mLs Intravenous Contrast Given 09/07/22 1536)     IMPRESSION / MDM / ASSESSMENT AND PLAN / ED COURSE  I reviewed the triage vital signs and the nursing notes.                              32 y.o. female with past medical history of PCOS and hypertension who presents to the ED complaining of constant pain under her left breast since waking up this morning along with febrile difficulty breathing.  Patient's presentation is most consistent with acute presentation with potential threat to life or bodily  function.  Differential diagnosis includes, but is not limited to, ACS, PE, dissection, pneumonia, pneumothorax, bronchitis, other viral syndrome, COVID-19, influenza, anemia, electrolyte abnormality, AKI, musculoskeletal pain, GERD.  Patient nontoxic-appearing and in no acute distress, vital signs remarkable for tachycardia but otherwise reassuring.  EKG shows sinus tachycardia with no ischemic changes, symptoms seem atypical for ACS but will screen troponin.  We will also check a D-dimer given her tachycardia, but patient low risk by Wells criteria.  Chest x-ray pending, labs thus far reassuring without significant anemia, leukocytosis, electrolyte abnormality, or AKI.  Pregnancy testing is negative.  2 sets of troponin within normal limits and I doubt ACS.  D-dimer was elevated however CTA negative for PE or other acute process.  Chest x-ray is also unremarkable.  COVID and flu testing is negative, on reassessment patient feeling much better with minimal pain.  Suspect symptoms may be due to bronchitis and patient is appropriate for discharge home with PCP follow-up.  She was counseled to return to the ED for new or worsening symptoms.  Patient agrees with plan.      FINAL CLINICAL IMPRESSION(S) / ED DIAGNOSES   Final diagnoses:  Nonspecific chest pain     Rx / DC Orders   ED Discharge Orders     None        Note:  This document was prepared using Dragon voice recognition software and may include unintentional dictation errors.   Chesley Noon, MD 09/07/22 [phone]

## 2022-09-18 IMAGING — US US ABDOMEN LIMITED
1 series · 14 of 25 positions shown · non-contrast
Comparison: CT stone study earlier same day.

CLINICAL DATA: Upper abdominal pain for 1 day.

EXAM:
ULTRASOUND ABDOMEN LIMITED RIGHT UPPER QUADRANT

[Series 1: us abdomen limited ruq (liver/gb) · 14 of 38 slices shown]
[im 1/38]
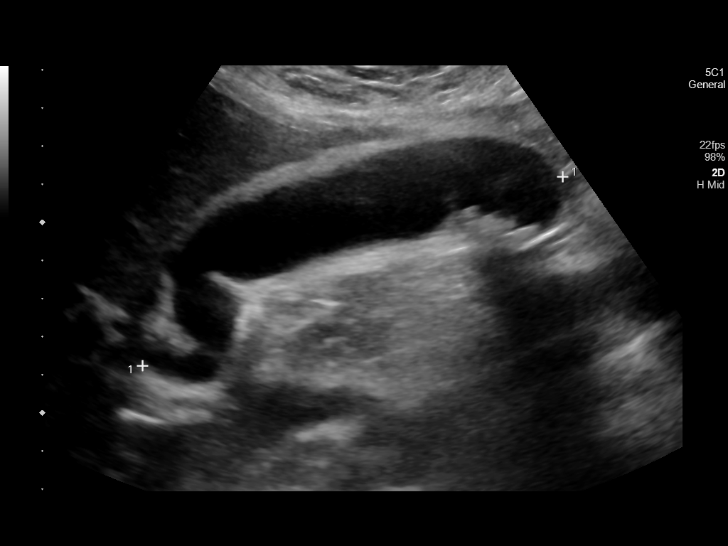
[im 4/38]
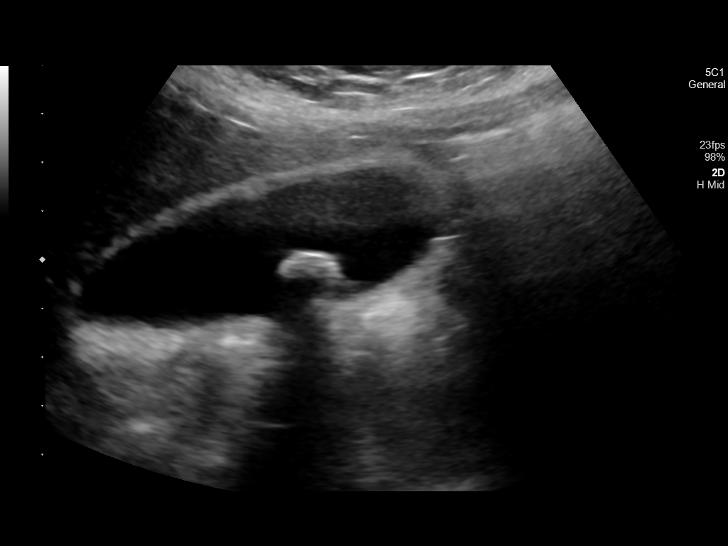
[im 7/38]
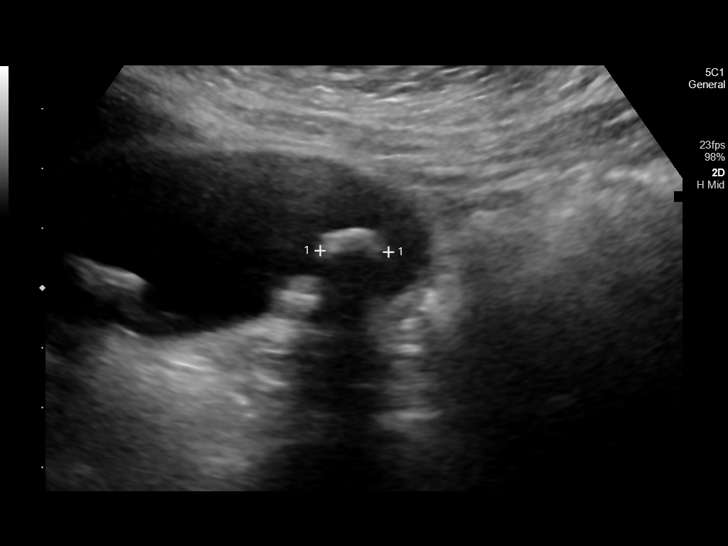
[im 10/38]
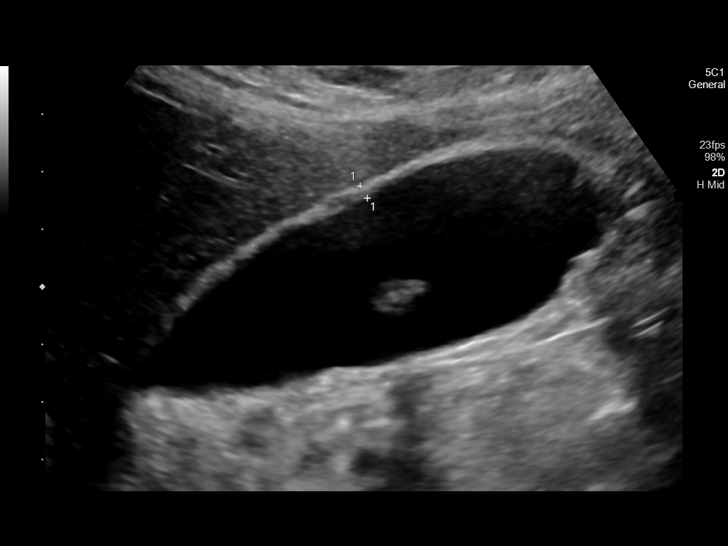
[im 13/38]
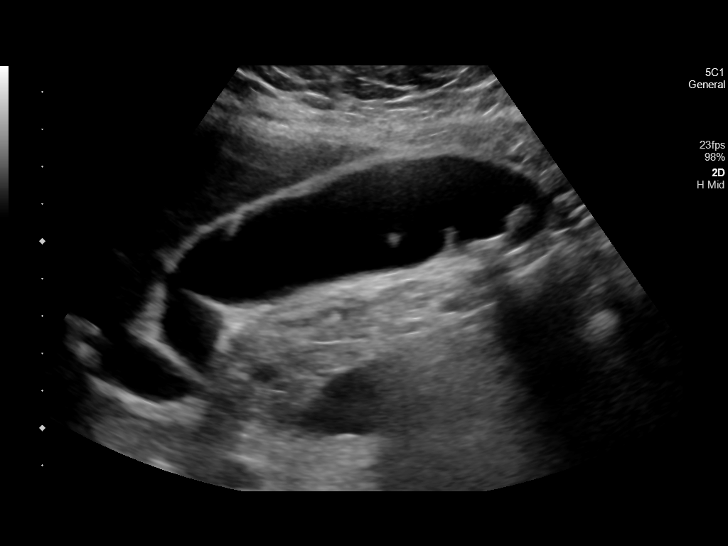
[im 14/38]
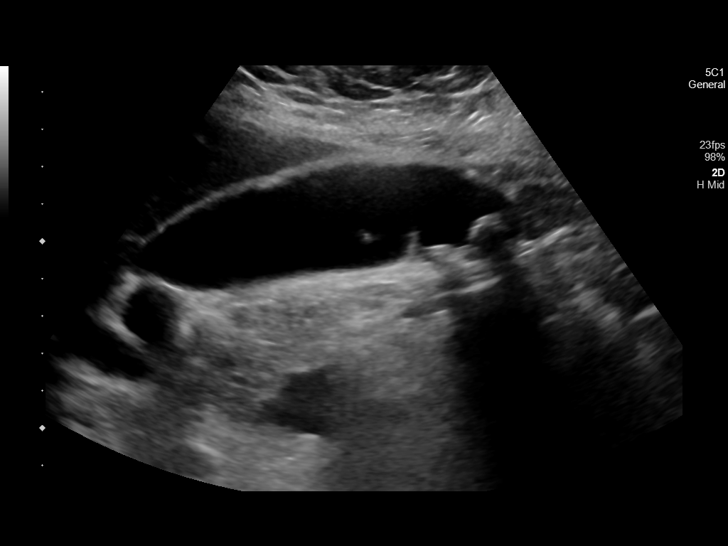
[im 17/38]
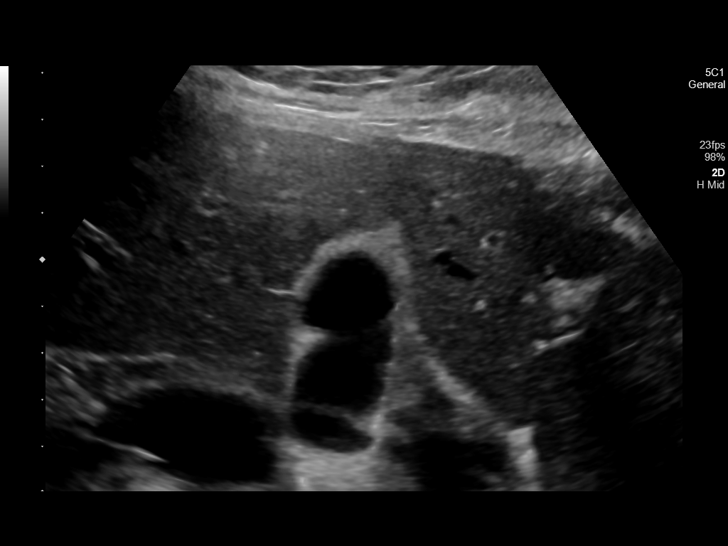
[im 21/38]
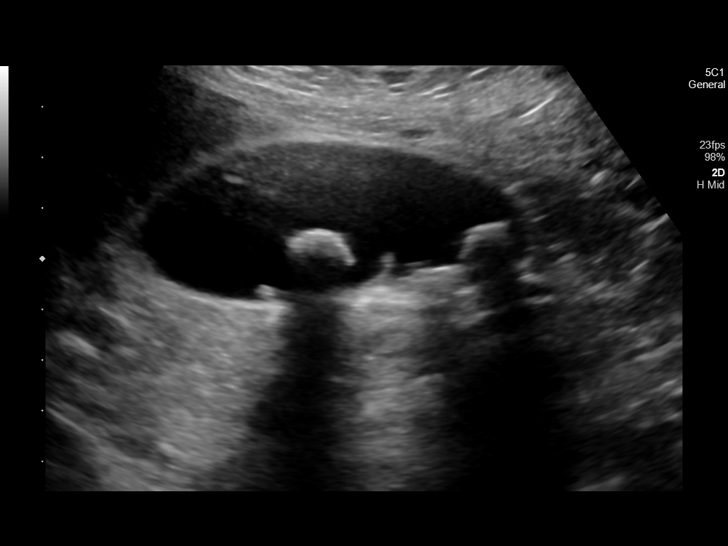
[im 24/38]
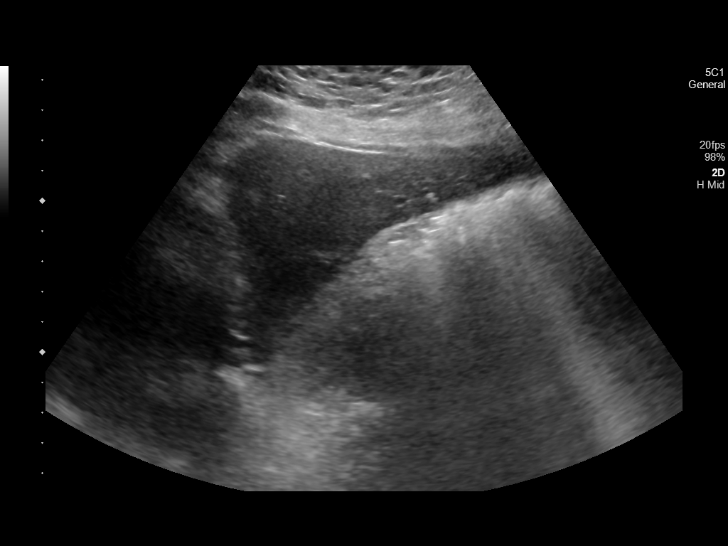
[im 25/38]
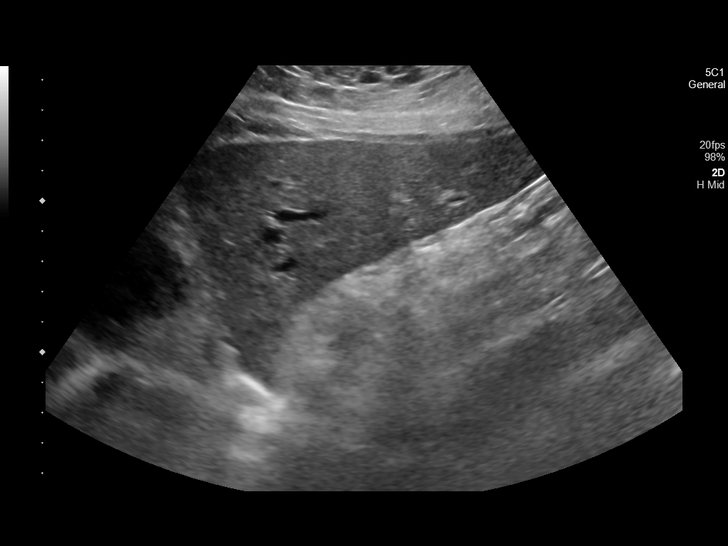
[im 28/38]
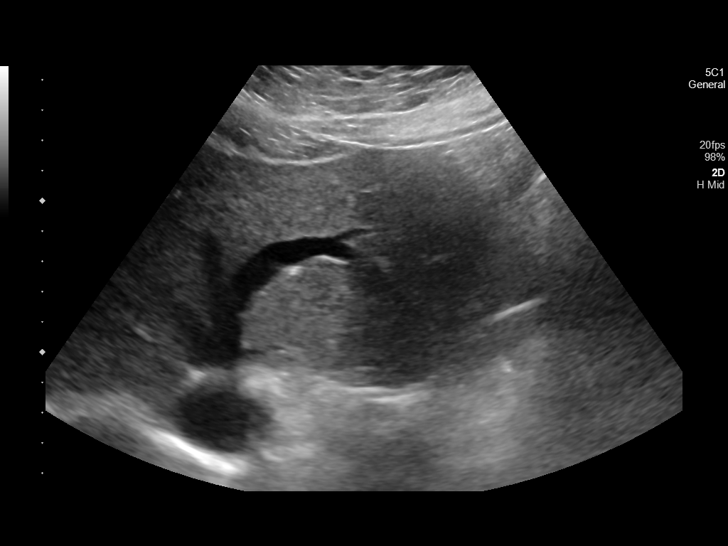
[im 31/38]
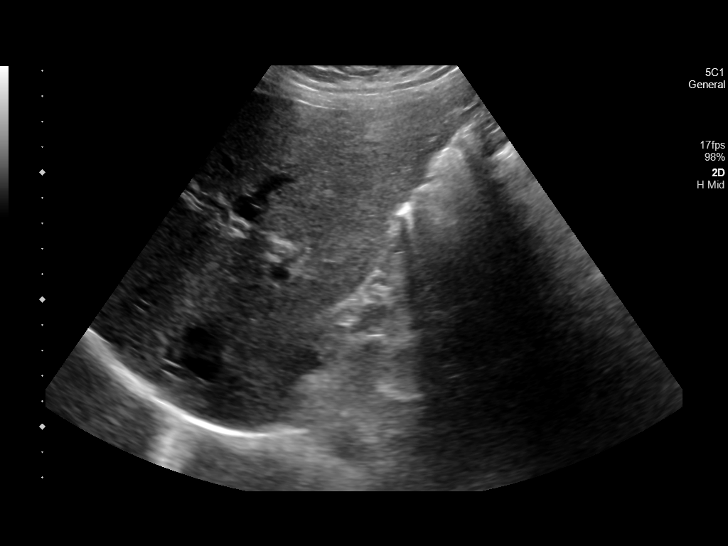
[im 34/38]
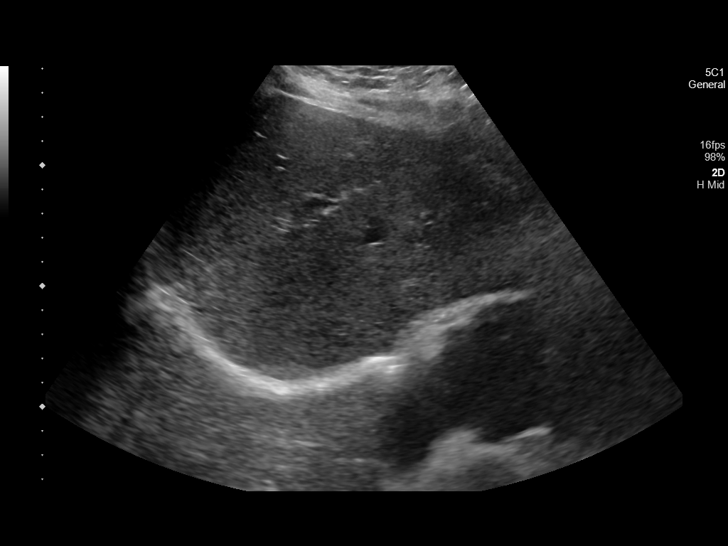
[im 38/38]
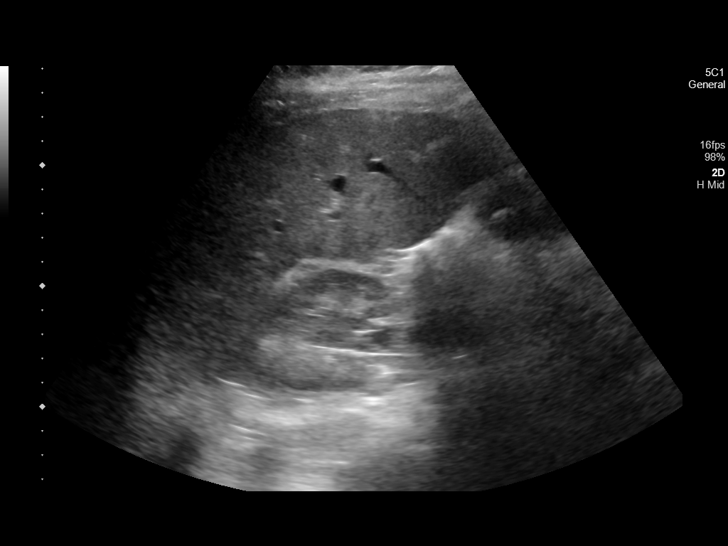

[14 of 25 positions shown; findings below may reference images not displayed]

FINDINGS: Gallbladder:

Multiple gallstones evident, measuring up to 11 mm. No gallbladder
wall thickening or pericholecystic fluid. Sonographer reports no
sonographic Murphy sign.

Common bile duct:

Diameter: 5 mm

Liver:

No focal lesion identified. Within normal limits in parenchymal
echogenicity. Portal vein is patent on color Doppler imaging with
normal direction of blood flow towards the liver.

Other: None.
IMPRESSION: Cholelithiasis. No sonographic findings to suggest acute
cholecystitis.

## 2022-09-18 IMAGING — CT CT RENAL STONE PROTOCOL
2 of 4 series · 16 of 46 positions shown, 18 images · non-contrast
Comparison: None.

CLINICAL DATA: Left upper quadrant abdominal pain and vomiting.

EXAM:
CT ABDOMEN AND PELVIS WITHOUT CONTRAST
TECHNIQUE: Multidetector CT imaging of the abdomen and pelvis was performed
following the standard protocol without IV contrast.

[Series 2: stone full standard · axial · 0.79mm/px · z∈[-996,-540]mm · 13 of 99 slices shown, 15 images]
[im 4/99  soft-tissue]
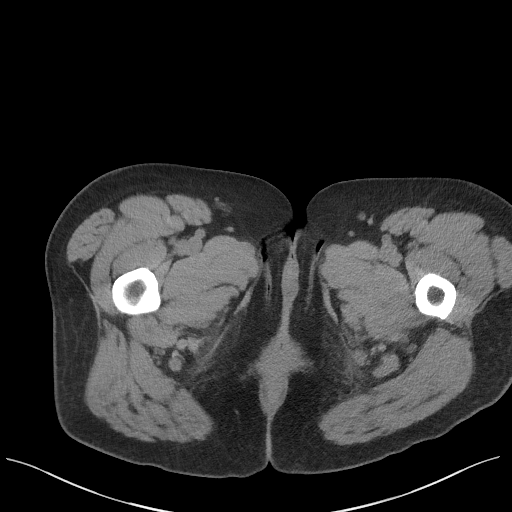
[im 4/99  bone]
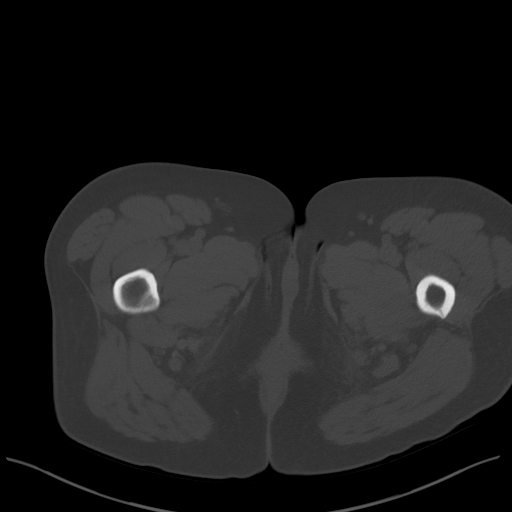
[im 12/99  soft-tissue]
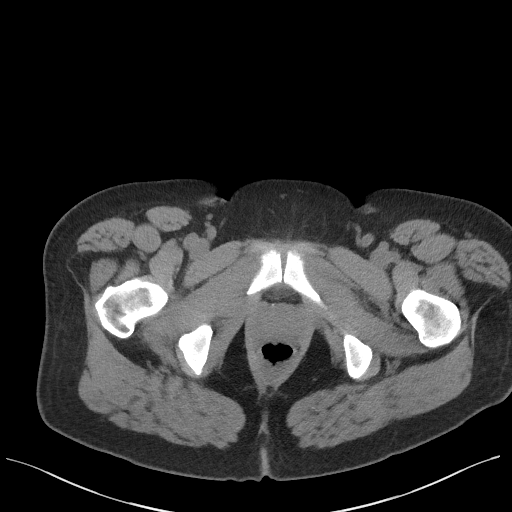
[im 20/99  soft-tissue]
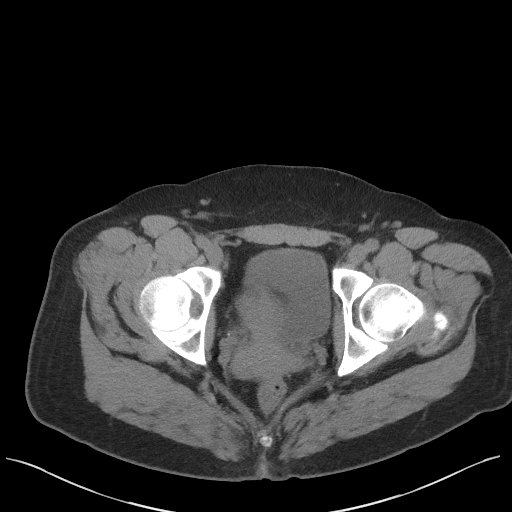
[im 28/99  soft-tissue]
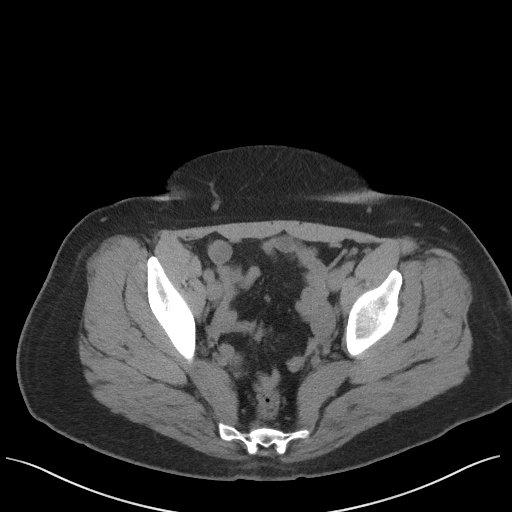
[im 36/99  soft-tissue]
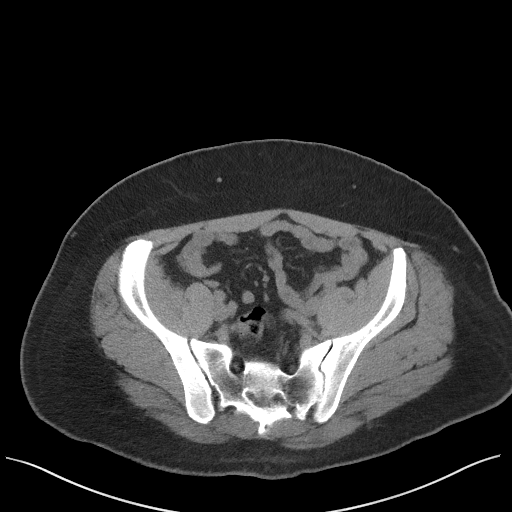
[im 44/99  soft-tissue]
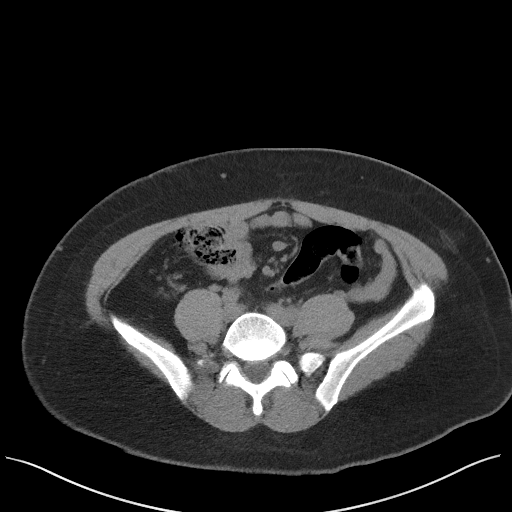
[im 51/99  soft-tissue]
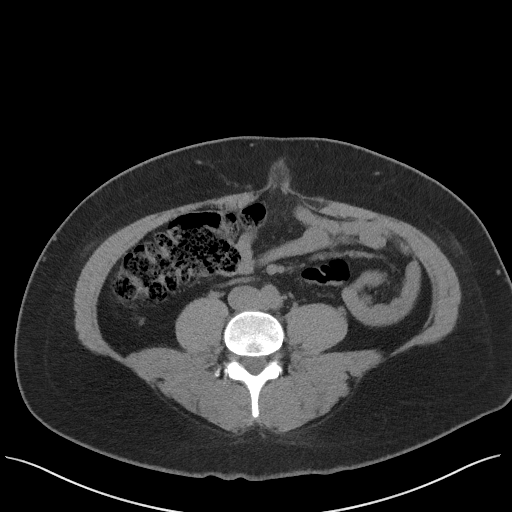
[im 55/99  soft-tissue]
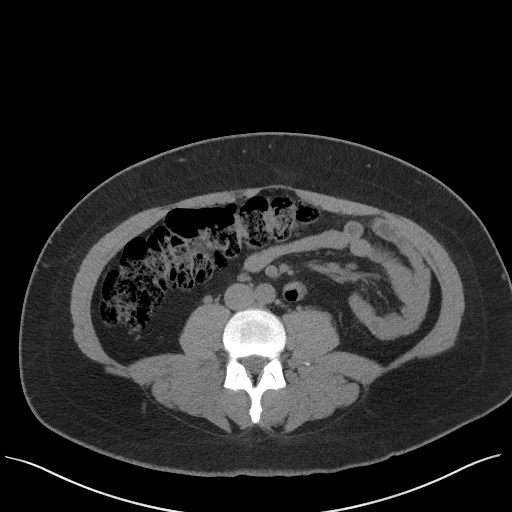
[im 63/99  soft-tissue]
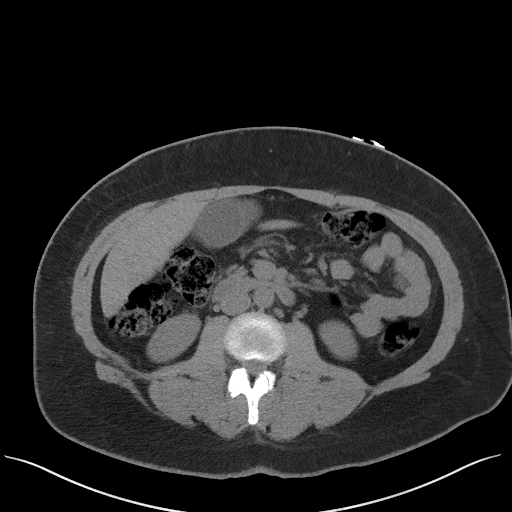
[im 63/99  bone]
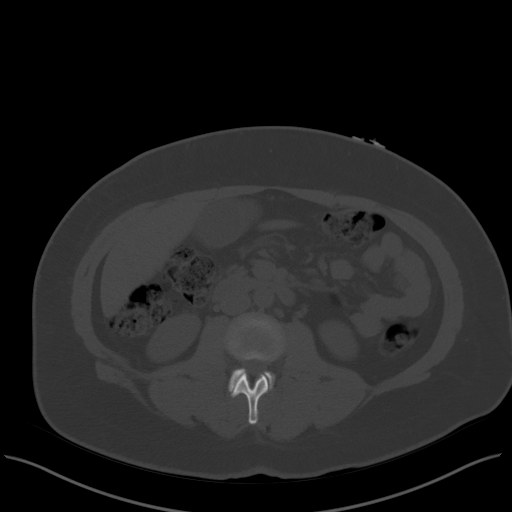
[im 71/99  soft-tissue]
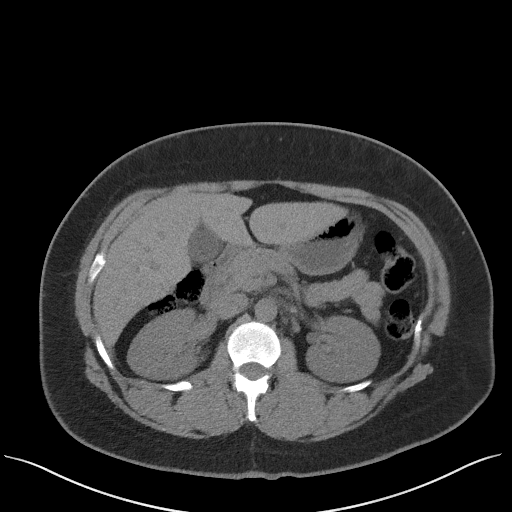
[im 79/99  soft-tissue]
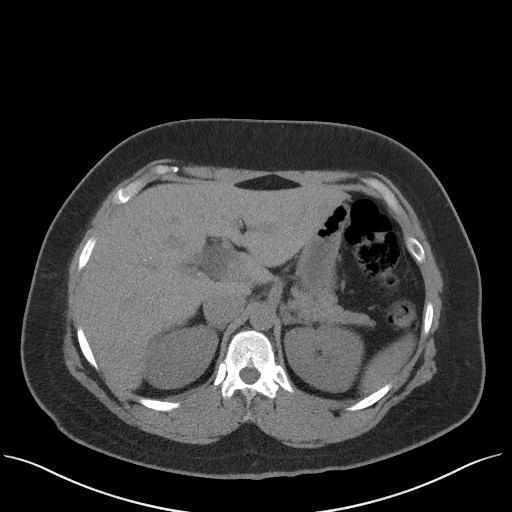
[im 87/99  soft-tissue]
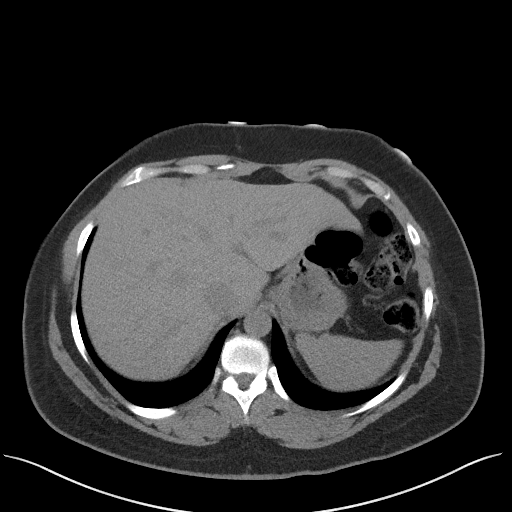
[im 95/99  soft-tissue]
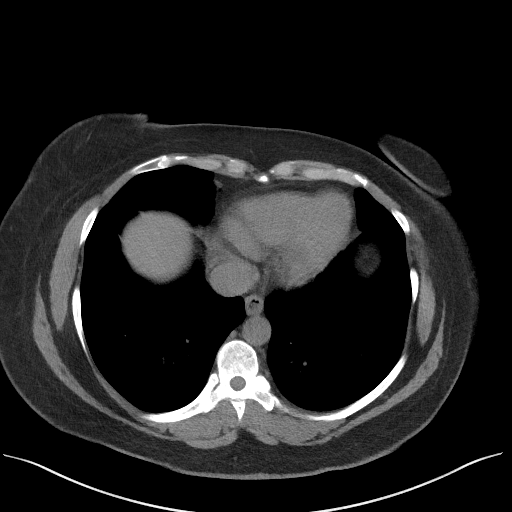

[Series 5: coronal · coronal · 0.72mm/px · 3 of 139 slices shown]
[im 47/139  soft-tissue]
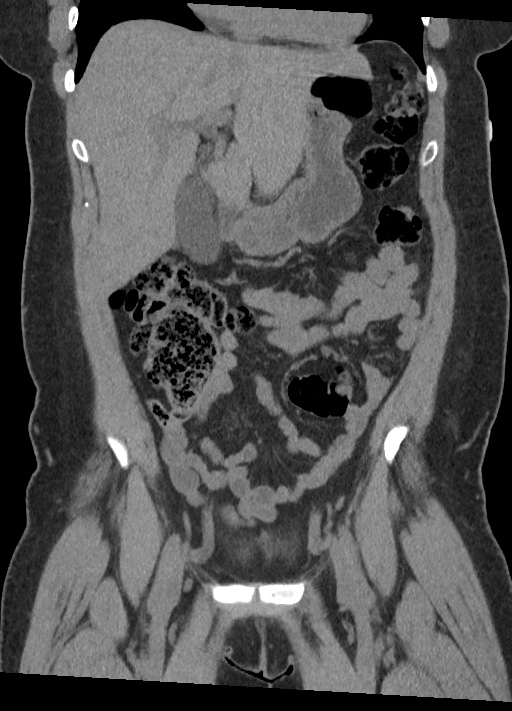
[im 62/139  soft-tissue]
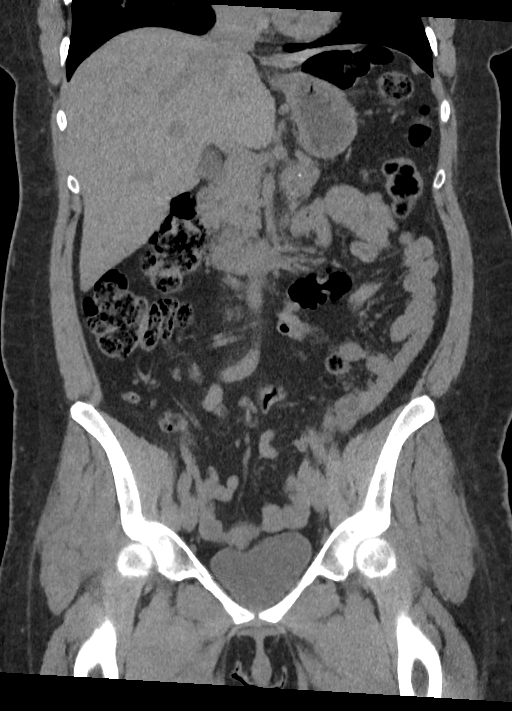
[im 77/139  soft-tissue]
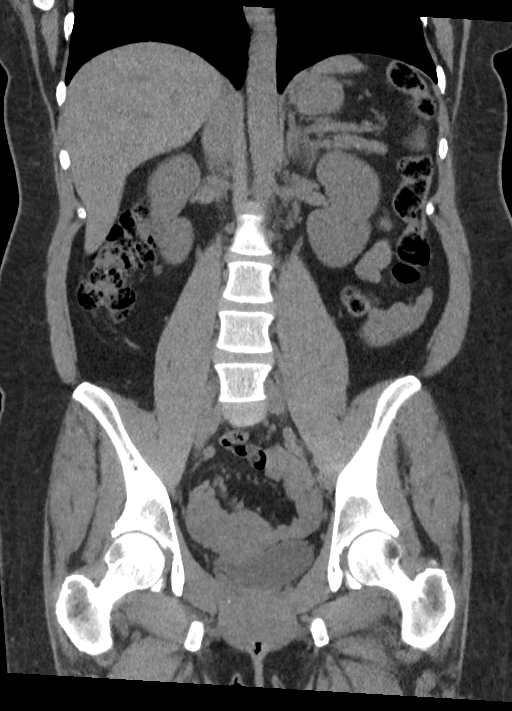

[16 of 46 positions shown; findings below may reference images not displayed]

FINDINGS: Lower chest: Clear lung bases.

Hepatobiliary: No focal liver abnormality is seen. Multiple stones
in the gallbladder measuring up to approximately 1 cm in size.
Questionable mild pericholecystic inflammation. No biliary
dilatation.

Pancreas: Punctate calcification in the distal pancreatic body. No
pancreatic ductal dilatation or peripancreatic inflammation.

Spleen: Unremarkable.

Adrenals/Urinary Tract: Unremarkable adrenal glands. No evidence of
renal mass, calculi, or hydronephrosis. Unremarkable bladder.

Stomach/Bowel: The stomach is unremarkable. There is no evidence of
bowel obstruction or inflammation. The appendix is mildly distended
by gas diffusely without evidence of appendicitis.

Vascular/Lymphatic: Normal caliber of the abdominal aorta. No
enlarged lymph nodes.

Reproductive: Uterus and bilateral adnexa are unremarkable.

Other: Small fat containing umbilical hernia. Trace free fluid in
the pelvis, potentially physiologic.

Musculoskeletal: No acute osseous abnormality or suspicious osseous
lesion.
IMPRESSION: 1. Cholelithiasis with questionable mild pericholecystic
inflammation. Consider right upper quadrant ultrasound for further
evaluation.
2. No evidence of urinary tract calculi or obstruction.

## 2022-11-17 DIAGNOSIS — N921 Excessive and frequent menstruation with irregular cycle: Secondary | ICD-10-CM | POA: Insufficient documentation

## 2023-01-02 ENCOUNTER — Emergency Department
Admission: EM | Admit: 2023-01-02 | Discharge: 2023-01-02 | Disposition: A | Discharge status: Home / Self Care | Payer: BC Managed Care – PPO | Attending: Emergency Medicine | Admitting: Emergency Medicine

## 2023-01-02 ENCOUNTER — Other Ambulatory Visit: Payer: Self-pay

## 2023-01-02 DIAGNOSIS — R42 Dizziness and giddiness: Secondary | ICD-10-CM

## 2023-01-02 DIAGNOSIS — U071 COVID-19: Secondary | ICD-10-CM | POA: Diagnosis not present

## 2023-01-02 LAB — RESP PANEL BY RT-PCR (RSV, FLU A&B, COVID)  RVPGX2
Influenza A by PCR: NEGATIVE
Influenza B by PCR: NEGATIVE
Resp Syncytial Virus by PCR: NEGATIVE
SARS Coronavirus 2 by RT PCR: POSITIVE — AB

## 2023-01-02 MED ORDER — MECLIZINE HCL 25 MG PO TABS: 25.0000 mg | ORAL_TABLET | Freq: Three times a day (TID) | ORAL | 0 refills | Status: DC | PRN

## 2023-01-02 MED ORDER — MECLIZINE HCL 25 MG PO TABS
25.0000 mg | ORAL_TABLET | Freq: Once | ORAL | Status: AC
  Administered 2023-01-02: 25 mg via ORAL
  Filled 2023-01-02: qty 1

## 2023-01-02 NOTE — ED Triage Notes (Signed)
Patient states dizziness since Wednesday after flying to [place].

## 2023-01-02 NOTE — ED Provider Notes (Signed)
[facility] Provider Note   Event Date/Time   First MD Initiated Contact with Patient 01/02/23 2039     (approximate) History  Dizziness  HPI Victoria Kerr is a 32 y.o. female with past medical history of sickle cell trait and PCOS who presents complaining of of lightheadedness for the last week after returning from a flight and cruise to [place].  Patient states that she has increasing lightheadedness that she feels like she is "swimming through the world".  Patient states that she had a lot of popping in her ears after her flight and cruise and is concerned that she may have trauma to her eardrums.  Patient states that she did have decreased hearing in the right ear for the first few days of her trip.  Patient denies any sensation of the room spinning around her.  Patient endorses subjective fevers and chills ROS: Patient currently denies any vision changes, tinnitus, difficulty speaking, facial droop, sore throat, chest pain, shortness of breath, abdominal pain, nausea/vomiting/diarrhea, dysuria, or weakness/numbness/paresthesias in any extremity   Physical Exam  Triage Vital Signs: ED Triage Vitals [01/02/23 1821]  Encounter Vitals Group     BP (!) 118/92     Systolic BP Percentile      Diastolic BP Percentile      Pulse Rate 99     Resp 20     Temp 98.8 F (37.1 C)     Temp Source Oral     SpO2 97 %     Weight 200 lb (90.7 kg)     Height 5\' 9"  (1.753 m)     Head Circumference      Peak Flow      Pain Score      Pain Loc      Pain Education      Exclude from Growth Chart    Most recent vital signs: Vitals:   01/02/23 1821  BP: (!) 118/92  Pulse: 99  Resp: 20  Temp: 98.8 F (37.1 C)  SpO2: 97%   General: Awake, oriented x4. CV:  Good peripheral perfusion.  Resp:  Normal effort.  Abd:  No distention.  Other:  Middle-aged overweight African-American female resting comfortably in no acute distress ED Results / Procedures / Treatments   Labs (all labs ordered are listed, but only abnormal results are displayed) Labs Reviewed  RESP PANEL BY RT-PCR (RSV, FLU A&B, COVID)  RVPGX2 - Abnormal; Notable for the following components:      Result Value   SARS Coronavirus 2 by RT PCR POSITIVE (*)    All other components within normal limits   PROCEDURES: Critical Care performed: No Procedures MEDICATIONS ORDERED IN ED: Medications  meclizine (ANTIVERT) tablet 25 mg (has no administration in time range)   IMPRESSION / MDM / ASSESSMENT AND PLAN / ED COURSE  I reviewed the triage vital signs and the nursing notes.                             The patient is on the cardiac monitor to evaluate for evidence of arrhythmia and/or significant heart rate changes. Patient's presentation is most consistent with acute presentation with potential threat to life or bodily function. Presentation most consistent with Viral Syndrome.  Patient has tested positive for COVID-19. Based on vitals and exam they are nontoxic and stable for discharge.  Given History and Exam I have a lower suspicion for: Emergent CardioPulmonary causes [such  as Acute Asthma or COPD Exacerbation, acute Heart Failure or exacerbation, PE, PTX, atypical ACS, PNA]. Emergent Otolaryngeal causes [such as PTA, RPA, Ludwigs, Epiglottitis, EBV].  Regarding Emergent Travel or Immunosuppressive related infectious: I have a low suspicion for acute HIV.  Will provide strict return precautions and instructions on self-isolation/quarantine and anticipatory guidance.   FINAL CLINICAL IMPRESSION(S) / ED DIAGNOSES   Final diagnoses:  COVID-19 virus infection  Lightheadedness  Dizziness   Rx / DC Orders   ED Discharge Orders          Ordered    meclizine (ANTIVERT) 25 MG tablet  3 times daily PRN        01/02/23 2116           Note:  This document was prepared using Dragon voice recognition software and may include unintentional dictation errors.   Merwyn Katos,  MD 01/02/23 2136

## 2023-07-30 ENCOUNTER — Encounter: Payer: Self-pay | Admitting: Emergency Medicine

## 2023-07-30 ENCOUNTER — Ambulatory Visit: Admission: EM | Admit: 2023-07-30 | Discharge: 2023-07-30 | Disposition: A | Discharge status: Home / Self Care

## 2023-07-30 DIAGNOSIS — I10 Essential (primary) hypertension: Secondary | ICD-10-CM | POA: Insufficient documentation

## 2023-07-30 DIAGNOSIS — D573 Sickle-cell trait: Secondary | ICD-10-CM | POA: Diagnosis not present

## 2023-07-30 DIAGNOSIS — R200 Anesthesia of skin: Secondary | ICD-10-CM | POA: Diagnosis not present

## 2023-07-30 DIAGNOSIS — E282 Polycystic ovarian syndrome: Secondary | ICD-10-CM | POA: Insufficient documentation

## 2023-07-30 DIAGNOSIS — Z880 Allergy status to penicillin: Secondary | ICD-10-CM | POA: Diagnosis not present

## 2023-07-30 DIAGNOSIS — Z79899 Other long term (current) drug therapy: Secondary | ICD-10-CM | POA: Diagnosis not present

## 2023-07-30 DIAGNOSIS — J029 Acute pharyngitis, unspecified: Secondary | ICD-10-CM | POA: Diagnosis not present

## 2023-07-30 DIAGNOSIS — R202 Paresthesia of skin: Secondary | ICD-10-CM | POA: Insufficient documentation

## 2023-07-30 DIAGNOSIS — R519 Headache, unspecified: Secondary | ICD-10-CM | POA: Diagnosis present

## 2023-07-30 DIAGNOSIS — E876 Hypokalemia: Secondary | ICD-10-CM | POA: Diagnosis not present

## 2023-07-30 DIAGNOSIS — R7303 Prediabetes: Secondary | ICD-10-CM | POA: Diagnosis not present

## 2023-07-30 LAB — COMPREHENSIVE METABOLIC PANEL WITH GFR
ALT: 18 U/L (ref 0–44)
AST: 17 U/L (ref 15–41)
Albumin: 4.4 g/dL (ref 3.5–5.0)
Alkaline Phosphatase: 31 U/L — ABNORMAL LOW (ref 38–126)
Anion gap: 10 (ref 5–15)
BUN: 11 mg/dL (ref 6–20)
CO2: 26 mmol/L (ref 22–32)
Calcium: 9.4 mg/dL (ref 8.9–10.3)
Chloride: 101 mmol/L (ref 98–111)
Creatinine, Ser: 0.94 mg/dL (ref 0.44–1.00)
GFR, Estimated: >60 mL/min (ref >60)
Glucose, Bld: 100 mg/dL — ABNORMAL HIGH (ref 70–99)
Potassium: 3.1 mmol/L — ABNORMAL LOW (ref 3.5–5.1)
Sodium: 137 mmol/L (ref 135–145)
Total Bilirubin: 0.2 mg/dL (ref 0.0–1.2)
Total Protein: 7.6 g/dL (ref 6.5–8.1)

## 2023-07-30 LAB — CBC WITH DIFFERENTIAL/PLATELET
Abs Immature Granulocytes: 0.04 10*3/uL (ref 0.00–0.07)
Basophils Absolute: 0 10*3/uL (ref 0.0–0.1)
Basophils Relative: 1 %
Eosinophils Absolute: 0.1 10*3/uL (ref 0.0–0.5)
Eosinophils Relative: 1 %
HCT: 35.4 % — ABNORMAL LOW (ref 36.0–46.0)
Hemoglobin: 12 g/dL (ref 12.0–15.0)
Immature Granulocytes: 1 %
Lymphocytes Relative: 34 %
Lymphs Abs: 3 10*3/uL (ref 0.7–4.0)
MCH: 26.1 pg (ref 26.0–34.0)
MCHC: 33.9 g/dL (ref 30.0–36.0)
MCV: 77.1 fL — ABNORMAL LOW (ref 80.0–100.0)
Monocytes Absolute: 0.5 10*3/uL (ref 0.1–1.0)
Monocytes Relative: 6 %
Neutro Abs: 5 10*3/uL (ref 1.7–7.7)
Neutrophils Relative %: 57 %
Platelets: 477 10*3/uL — ABNORMAL HIGH (ref 150–400)
RBC: 4.59 MIL/uL (ref 3.87–5.11)
RDW: 14 % (ref 11.5–15.5)
WBC: 8.6 10*3/uL (ref 4.0–10.5)
nRBC: 0 % (ref 0.0–0.2)

## 2023-07-30 LAB — GROUP A STREP BY PCR: Group A Strep by PCR: NOT DETECTED

## 2023-07-30 MED ORDER — POTASSIUM CHLORIDE CRYS ER 20 MEQ PO TBCR: 40.0000 meq | EXTENDED_RELEASE_TABLET | Freq: Two times a day (BID) | ORAL | 0 refills | Status: DC

## 2023-07-30 MED ORDER — AZITHROMYCIN 250 MG PO TABS: 250.0000 mg | ORAL_TABLET | Freq: Every day | ORAL | 0 refills | Status: DC

## 2023-07-30 NOTE — Discharge Instructions (Addendum)
 Your strep test today was negative but because you been experiencing a sore throat for 10 days and you have a mildly elevated temp here in clinic I will treat you for potential bacterial pharyngitis.    Take azithromycin once daily for 5 days for treatment of your pharyngitis.  You also have a mildly low potassium which could be contributing to the numbness you are experiencing.  Take 40 mill equivalents of potassium twice daily for 2 days to help replenish your potassium.  If your numbness does not improve with the potassium repletion then I want you to follow-up with neurology.  I have made a referral so they will call you to make an appointment.  If your symptoms resolve then you can cancel the appointment.

## 2023-07-30 NOTE — ED Provider Notes (Signed)
 [facility]    CSN: [id number] Arrival date & time: 07/30/23  1628      History   Chief Complaint Chief Complaint  Patient presents with   Sore Throat   Headache   Numbness    HPI Victoria Kerr is a 33 y.o. female.   HPI  33 year old female with past medical history significant for sickle cell trait, essential hypertension, prediabetes, migraine headaches, and PCOS presents for evaluation of a sore throat which has been present for the last 10 days.  The patient is also concerned because 2 days ago she woke up with a headache behind her left eye and noticed that her left eye was bloodshot.  She went back to bed and then when she got up a couple hours later she was experiencing numbness to the entire left side of her body.  She reports that she went to [facility] where she was evaluated for stroke and had a CT scan that was negative.  They diagnosed her with a complex migraine and gave her Decadron and Compazine.  The Compazine caused her to feel anxious and restless but as the medication wore off those symptoms resolved.  She is continuing to experience numbness on the left side of her body but she denies any weakness.  She also denies any changes in vision, nausea, vomiting, or fever.  Past Medical History:  Diagnosis Date   Headache    MIGRAINES   PCOS (polycystic ovarian syndrome) 2013    Patient Active Problem List   Diagnosis Date Noted   Sickle cell trait (HCC) 07/30/2023   Breakthrough bleeding 11/17/2022   Essential hypertension 08/18/2022   PCOS (polycystic ovarian syndrome) 10/10/2021   Anxiety 06/19/2019   Palpitations 02/12/2019   Sickle cell trait in mother affecting childbirth (HCC) 05/21/2018   Prediabetes 02/01/2017    Past Surgical History:  Procedure Laterality Date   NO PAST SURGERIES      OB History     Gravida  1   Para      Term      Preterm      AB      Living         SAB      IAB      Ectopic       Multiple      Live Births               Home Medications    Prior to Admission medications   Medication Sig Start Date End Date Taking? Authorizing Provider  azithromycin (ZITHROMAX Z-PAK) 250 MG tablet Take 1 tablet (250 mg total) by mouth daily. Take 2 tablets on the first day and then 1 tablet daily thereafter for a total of 5 days of treatment. 07/30/23  Yes Becky Augusta, NP  cholestyramine Lanetta Inch) 4 g packet Take 4 g by mouth daily at 2 PM. 04/24/23 04/23/24 Yes [provider]  doxycycline (ADOXA) 100 MG tablet Take 1 tablet by mouth daily. 08/15/22  Yes [provider]  lisinopril-hydrochlorothiazide (ZESTORETIC) 20-12.5 MG tablet Take 1 tablet by mouth daily. 08/18/22 08/18/23 Yes [provider]  metFORMIN (GLUCOPHAGE-XR) 500 MG 24 hr tablet Take 1,000 mg by mouth 2 (two) times daily with a meal. 11/16/22  Yes [provider]  potassium chloride SA (KLOR-CON M) 20 MEQ tablet Take 2 tablets (40 mEq total) by mouth 2 (two) times daily for 2 days. 07/30/23 08/01/23 Yes Becky Augusta, NP  Sulfacetamide Sodium 10 %  LIQD  07/19/22  Yes [provider]  tirzepatide (ZEPBOUND) 7.5 MG/0.5ML Pen Inject 7.5 mg into the skin once a week. 07/19/23  Yes [provider]  triamcinolone cream (KENALOG) 0.1 % Apply 1 Application topically 2 (two) times daily. 07/19/22  Yes [provider]  Trifarotene (AKLIEF) 0.005 % CREA  07/20/22  Yes [provider]  meclizine (ANTIVERT) 25 MG tablet Take 1 tablet (25 mg total) by mouth 3 (three) times daily as needed for dizziness. 01/02/23   Charleen Conn, MD    Family History Family History  Problem Relation Age of Onset   Heart attack Mother     Social History Social History   Tobacco Use   Smoking status: Never   Smokeless tobacco: Never  Vaping Use   Vaping status: Never Used  Substance Use Topics   Alcohol use: Yes    Comment: Occassionally.   Drug use: Never     Allergies    Penicillins and Wound dressing adhesive   Review of Systems Review of Systems  Constitutional:  Negative for fever.  HENT:  Positive for sore throat. Negative for congestion, ear pain and rhinorrhea.   Eyes:  Negative for visual disturbance.  Respiratory:  Negative for cough.   Gastrointestinal:  Negative for nausea and vomiting.  Neurological:  Positive for numbness. Negative for dizziness, facial asymmetry, weakness and headaches.     Physical Exam Triage Vital Signs ED Triage Vitals  Encounter Vitals Group     BP      Systolic BP Percentile      Diastolic BP Percentile      Pulse      Resp      Temp      Temp src      SpO2      Weight      Height      Head Circumference      Peak Flow      Pain Score      Pain Loc      Pain Education      Exclude from Growth Chart    No data found.  Updated Vital Signs BP 116/87 (BP Location: Left Arm)   Pulse (!) 104   Temp 99 F (37.2 C) (Oral)   Resp 18   Ht 5\' 9"  (1.753 m)   Wt 183 lb (83 kg)   LMP 07/16/2023 (Exact Date)   SpO2 100%   BMI 27.02 kg/m   Visual Acuity Right Eye Distance: 20/25 Left Eye Distance: 20/25 Bilateral Distance: 20/25 (w/correction)  Right Eye Near:   Left Eye Near:    Bilateral Near:     Physical Exam Vitals and nursing note reviewed.  Constitutional:      Appearance: Normal appearance. She is not ill-appearing.  HENT:     Head: Normocephalic and atraumatic.     Right Ear: Tympanic membrane, ear canal and external ear normal. There is no impacted cerumen.     Left Ear: Tympanic membrane, ear canal and external ear normal. There is no impacted cerumen.     Mouth/Throat:     Mouth: Mucous membranes are moist.     Pharynx: Oropharyngeal exudate and posterior oropharyngeal erythema present.     Comments: Tonsillar pillars are 1+ edematous and mildly erythematous with white exudate. Eyes:     General: No scleral icterus.    Extraocular Movements: Extraocular movements intact.      Conjunctiva/sclera: Conjunctivae normal.     Pupils: Pupils are  equal, round, and reactive to light.  Cardiovascular:     Rate and Rhythm: Normal rate and regular rhythm.     Pulses: Normal pulses.     Heart sounds: Normal heart sounds. No murmur heard.    No friction rub. No gallop.  Pulmonary:     Effort: Pulmonary effort is normal.     Breath sounds: Normal breath sounds. No wheezing, rhonchi or rales.  Musculoskeletal:     Cervical back: Normal range of motion and neck supple. No tenderness.  Lymphadenopathy:     Cervical: No cervical adenopathy.  Skin:    General: Skin is warm and dry.     Capillary Refill: Capillary refill takes less than 2 seconds.     Findings: No rash.  Neurological:     General: No focal deficit present.     Mental Status: She is alert and oriented to person, place, and time.     Cranial Nerves: Cranial nerve deficit present.     Sensory: No sensory deficit.     Motor: No weakness.     Coordination: Coordination normal.     Deep Tendon Reflexes: Reflexes normal.      UC Treatments / Results  Labs (all labs ordered are listed, but only abnormal results are displayed) Labs Reviewed  CBC WITH DIFFERENTIAL/PLATELET - Abnormal; Notable for the following components:      Result Value   HCT 35.4 (*)    MCV 77.1 (*)    Platelets 477 (*)    All other components within normal limits  COMPREHENSIVE METABOLIC PANEL WITH GFR - Abnormal; Notable for the following components:   Potassium 3.1 (*)    Glucose, Bld 100 (*)    Alkaline Phosphatase 31 (*)    All other components within normal limits  GROUP A STREP BY PCR    EKG   Radiology No results found.  Procedures Procedures (including critical care time)  Medications Ordered in UC Medications - No data to display  Initial Impression / Assessment and Plan / UC Course  I have reviewed the triage vital signs and the nursing notes.  Pertinent labs & imaging results that were available during my  care of the patient were reviewed by me and considered in my medical decision making (see chart for details).  Clinical Course as of 07/30/23 1752  Mon Jul 30, 2023  1743 Alkaline Phosphatase(!): 31 [JR]    Clinical Course User Index [JR] Becky Augusta, NP  Patient is a pleasant, nontoxic-appearing 33 year old female presenting for evaluation of sore throat and left-sided body numbness as outlined in HPI above.  In the exam room she is alert and oriented x 4 and is able to verbalize a concise history of events over the last 10 days.  She is moving all extremities independently.  Cranial nerves II through XII are intact save for decreased sensation in the middle branch of her left facial nerve.  Pupils equal round reactive and EOM is intact.  She does have erythematous and edematous tonsillar pillars with white exudate.  No cervical lymphadenopathy present.  Cardiopulmonary exam reveals S1-S2 heart sounds with regular rate rhythm lung sounds are clear to auscultation all fields.  Bilateral grips, upper extremity strength, and lower extremity strength are all 5/5.  DTRs are 2+ globally.  Differential diagnosis includes strep pharyngitis, temporal arteritis, CVA, TIA, complex migraine.  Given that her headache has resolved and the numbness remains I am less concerned about CVA TIA, or complex migraine.  I will  order a strep PCR.  CBC shows normal white count red count.  Hematocrit is mildly low at 35.4 but hemoglobin is normal at 12.  Platelets are mildly elevated at 477.  No abnormalities to the differential.  CMP shows mild hypokalemia with a potassium of 3.1.  Sodium and chloride are both normal as is renal function and transaminases save for a mildly low alkaline phosphatase of 31.  Strep PCR is negative.  I will discharge patient home with a diagnosis of hypokalemia and replete her potassium with 40 mEq's twice daily for 2 days.  This could be contributing to her numbness that she is experiencing.   Even though the patient's strep test is negative, given that she has been experiencing a sore throat for 10 days and she has a mildly elevated temp of 99, I will treat her for potential bacterial pharyngitis with azithromycin.  She has a hive allergy to penicillin.   Final Clinical Impressions(s) / UC Diagnoses   Final diagnoses:  Hypokalemia  Paresthesia  Pharyngitis, unspecified etiology     Discharge Instructions      Your strep test today was negative but because you been experiencing a sore throat for 10 days and you have a mildly elevated temp here in clinic I will treat you for potential bacterial pharyngitis.    Take azithromycin once daily for 5 days for treatment of your pharyngitis.  You also have a mildly low potassium which could be contributing to the numbness you are experiencing.  Take 40 mill equivalents of potassium twice daily for 2 days to help replenish your potassium.  If your numbness does not improve with the potassium repletion then I want you to follow-up with neurology.  I have made a referral so they will call you to make an appointment.  If your symptoms resolve then you can cancel the appointment.     ED Prescriptions     Medication Sig Dispense Auth. Provider   azithromycin (ZITHROMAX Z-PAK) 250 MG tablet Take 1 tablet (250 mg total) by mouth daily. Take 2 tablets on the first day and then 1 tablet daily thereafter for a total of 5 days of treatment. 6 tablet Kent Pear, NP   potassium chloride SA (KLOR-CON M) 20 MEQ tablet Take 2 tablets (40 mEq total) by mouth 2 (two) times daily for 2 days. 8 tablet Kent Pear, NP      PDMP not reviewed this encounter.   Kent Pear, NP 07/30/23 1752

## 2023-07-30 NOTE — ED Triage Notes (Signed)
 Here with Husband. "Starting April 1st, woke up with sore throat that remains (on/off), April 12th woke up with ache behind my left, noticed it was blood shot, then immediately noticed numbness on left side of my body". "I immediately went to [facility], ruled out stroke, ending diagnosis was Migraine, given Rx (unknown name), once finishing this medication became restless, anxious (this was yesterday) but all this is still occurring, I do have an appt with my PCP tomorrow". No dizziness or room spinning. No chest pain. No nausea.

## 2023-08-19 ENCOUNTER — Emergency Department

## 2023-08-19 ENCOUNTER — Observation Stay
Admission: EM | Admit: 2023-08-19 | Discharge: 2023-08-19 | Disposition: A | Discharge status: Home / Self Care | Attending: Internal Medicine | Admitting: Internal Medicine

## 2023-08-19 ENCOUNTER — Observation Stay

## 2023-08-19 DIAGNOSIS — Z79899 Other long term (current) drug therapy: Secondary | ICD-10-CM | POA: Diagnosis not present

## 2023-08-19 DIAGNOSIS — R1033 Periumbilical pain: Secondary | ICD-10-CM

## 2023-08-19 DIAGNOSIS — E669 Obesity, unspecified: Secondary | ICD-10-CM | POA: Diagnosis not present

## 2023-08-19 DIAGNOSIS — Z7984 Long term (current) use of oral hypoglycemic drugs: Secondary | ICD-10-CM | POA: Diagnosis not present

## 2023-08-19 DIAGNOSIS — E119 Type 2 diabetes mellitus without complications: Secondary | ICD-10-CM | POA: Insufficient documentation

## 2023-08-19 DIAGNOSIS — E876 Hypokalemia: Secondary | ICD-10-CM

## 2023-08-19 DIAGNOSIS — K429 Umbilical hernia without obstruction or gangrene: Principal | ICD-10-CM | POA: Insufficient documentation

## 2023-08-19 DIAGNOSIS — Z7985 Long-term (current) use of injectable non-insulin antidiabetic drugs: Secondary | ICD-10-CM | POA: Insufficient documentation

## 2023-08-19 DIAGNOSIS — K56600 Partial intestinal obstruction, unspecified as to cause: Secondary | ICD-10-CM | POA: Diagnosis not present

## 2023-08-19 DIAGNOSIS — K56609 Unspecified intestinal obstruction, unspecified as to partial versus complete obstruction: Secondary | ICD-10-CM | POA: Diagnosis not present

## 2023-08-19 DIAGNOSIS — R109 Unspecified abdominal pain: Secondary | ICD-10-CM | POA: Diagnosis present

## 2023-08-19 LAB — COMPREHENSIVE METABOLIC PANEL WITH GFR
ALT: 13 U/L (ref 0–44)
AST: 16 U/L (ref 15–41)
Albumin: 4.1 g/dL (ref 3.5–5.0)
Alkaline Phosphatase: 27 U/L — ABNORMAL LOW (ref 38–126)
Anion gap: 11 (ref 5–15)
BUN: 15 mg/dL (ref 6–20)
CO2: 22 mmol/L (ref 22–32)
Calcium: 9.5 mg/dL (ref 8.9–10.3)
Chloride: 105 mmol/L (ref 98–111)
Creatinine, Ser: 0.85 mg/dL (ref 0.44–1.00)
GFR, Estimated: >60 mL/min (ref >60)
Glucose, Bld: 125 mg/dL — ABNORMAL HIGH (ref 70–99)
Potassium: 3.1 mmol/L — ABNORMAL LOW (ref 3.5–5.1)
Sodium: 138 mmol/L (ref 135–145)
Total Bilirubin: 0.6 mg/dL (ref 0.0–1.2)
Total Protein: 7.3 g/dL (ref 6.5–8.1)

## 2023-08-19 LAB — URINALYSIS, ROUTINE W REFLEX MICROSCOPIC
Bacteria, UA: NONE SEEN
Bilirubin Urine: NEGATIVE
Glucose, UA: NEGATIVE mg/dL
Ketones, ur: 20 mg/dL — AB
Leukocytes,Ua: NEGATIVE
Nitrite: NEGATIVE
Protein, ur: NEGATIVE mg/dL
Specific Gravity, Urine: 1.015 (ref 1.005–1.030)
pH: 6 (ref 5.0–8.0)

## 2023-08-19 LAB — CBC
HCT: 34.4 % — ABNORMAL LOW (ref 36.0–46.0)
Hemoglobin: 11.6 g/dL — ABNORMAL LOW (ref 12.0–15.0)
MCH: 26.2 pg (ref 26.0–34.0)
MCHC: 33.7 g/dL (ref 30.0–36.0)
MCV: 77.8 fL — ABNORMAL LOW (ref 80.0–100.0)
Platelets: 361 10*3/uL (ref 150–400)
RBC: 4.42 MIL/uL (ref 3.87–5.11)
RDW: 13.5 % (ref 11.5–15.5)
WBC: 7.2 10*3/uL (ref 4.0–10.5)
nRBC: 0 % (ref 0.0–0.2)

## 2023-08-19 LAB — HIV ANTIBODY (ROUTINE TESTING W REFLEX): HIV Screen 4th Generation wRfx: NONREACTIVE

## 2023-08-19 LAB — POC URINE PREG, ED
Preg Test, Ur: NEGATIVE
Preg Test, Ur: NEGATIVE

## 2023-08-19 MED ORDER — KETOROLAC TROMETHAMINE 15 MG/ML IJ SOLN
15.0000 mg | Freq: Once | INTRAMUSCULAR | Status: AC | PRN
  Administered 2023-08-19: 15 mg via INTRAVENOUS
  Filled 2023-08-19: qty 1

## 2023-08-19 MED ORDER — HYDROCHLOROTHIAZIDE 12.5 MG PO TABS
12.5000 mg | ORAL_TABLET | Freq: Every day | ORAL | Status: DC
  Administered 2023-08-19: 12.5 mg via ORAL
  Filled 2023-08-19: qty 1

## 2023-08-19 MED ORDER — ONDANSETRON HCL 4 MG/2ML IJ SOLN
4.0000 mg | Freq: Once | INTRAMUSCULAR | Status: AC
  Administered 2023-08-19: 4 mg via INTRAVENOUS
  Filled 2023-08-19: qty 2

## 2023-08-19 MED ORDER — POTASSIUM CHLORIDE CRYS ER 20 MEQ PO TBCR
40.0000 meq | EXTENDED_RELEASE_TABLET | Freq: Once | ORAL | Status: AC
  Administered 2023-08-19: 40 meq via ORAL
  Filled 2023-08-19: qty 2

## 2023-08-19 MED ORDER — LISINOPRIL 20 MG PO TABS
20.0000 mg | ORAL_TABLET | Freq: Every day | ORAL | Status: DC
  Administered 2023-08-19: 20 mg via ORAL
  Filled 2023-08-19: qty 2

## 2023-08-19 MED ORDER — ONDANSETRON HCL 4 MG/2ML IJ SOLN
4.0000 mg | Freq: Four times a day (QID) | INTRAMUSCULAR | Status: DC | PRN
Start: 2023-08-19 — End: 2023-08-20

## 2023-08-19 MED ORDER — TIRZEPATIDE-WEIGHT MANAGEMENT 7.5 MG/0.5ML ~~LOC~~ SOAJ
7.5000 mg | SUBCUTANEOUS | Status: DC
Start: 2023-08-19 — End: 2023-08-19

## 2023-08-19 MED ORDER — ONDANSETRON HCL 4 MG PO TABS
4.0000 mg | ORAL_TABLET | Freq: Four times a day (QID) | ORAL | Status: DC | PRN
Start: 2023-08-19 — End: 2023-08-20

## 2023-08-19 MED ORDER — HYDROMORPHONE HCL 1 MG/ML IJ SOLN: 0.5000 mg | INTRAMUSCULAR | Status: DC | PRN

## 2023-08-19 MED ORDER — METFORMIN HCL ER 500 MG PO TB24: 1000.0000 mg | ORAL_TABLET | Freq: Two times a day (BID) | ORAL | Status: DC

## 2023-08-19 MED ORDER — DIATRIZOATE MEGLUMINE & SODIUM 66-10 % PO SOLN
90.0000 mL | Freq: Once | ORAL | Status: AC
  Administered 2023-08-19: 90 mL via ORAL

## 2023-08-19 MED ORDER — LISINOPRIL-HYDROCHLOROTHIAZIDE 20-12.5 MG PO TABS: 1.0000 | ORAL_TABLET | Freq: Every day | ORAL | Status: DC

## 2023-08-19 MED ORDER — MORPHINE SULFATE (PF) 4 MG/ML IV SOLN
4.0000 mg | Freq: Once | INTRAVENOUS | Status: AC
  Administered 2023-08-19: 4 mg via INTRAVENOUS
  Filled 2023-08-19: qty 1

## 2023-08-19 MED ORDER — IOHEXOL 300 MG/ML  SOLN
100.0000 mL | Freq: Once | INTRAMUSCULAR | Status: AC | PRN
  Administered 2023-08-19: 100 mL via INTRAVENOUS

## 2023-08-19 NOTE — ED Notes (Signed)
 Attempted to place NG at this time. Patient states, "I cannot do this. This is too much. Take it out". RN took the NG out. Provider made aware.

## 2023-08-19 NOTE — ED Notes (Signed)
 Pt finished oral contrast. Janina Meissner, RT notified.

## 2023-08-19 NOTE — Discharge Instructions (Addendum)
 Fortunately your CT scan did not show any emergency conditions that would require hospitalization or surgery tonight.  I suspect that your umbilical hernia is causing your pain.  Please call Dr. Charmel Cooter who is the general surgeon for an appointment at a later date to talk about surgical options.  Your potassium levels were slightly low, see the attached document for more information on low potassium and ways to increase intake in your diet.  Have your doctor recheck these levels at your next appointment.  Thank you for choosing us  for your health care today!  Please see your primary doctor this week for a follow up appointment.   If you have any new, worsening, or unexpected symptoms call your doctor right away or come back to the emergency department for reevaluation.  It was my pleasure to care for you today.   Victoria Kerr Margery Sheets, MD

## 2023-08-19 NOTE — ED Provider Notes (Signed)
 [facility] Provider Note    Event Date/Time   First MD Initiated Contact with Patient 08/19/23 0319     (approximate)   History   Abdominal Pain   HPI  Victoria Kerr is a 33 y.o. female   Past medical history of PCOS who presents to the Emergency Department with abdominal pain.  Acute onset after lifting a 50 pound luggage bag to weigh it in anticipation of a cruise that she is going on next week.  She has been dealing with a longstanding umbilical hernia, small bump right inside of her bellybutton that is soft and does not cause her any pain, and she has noticed that there for many years.  Over the past several months when she strains on the toilet or lifts objects she notices that that area becomes transiently irritated but immediately gets better after she rests.  Tonight a similar occurrence after lifting a luggage bag but the pain was quite severe and the bulge in her bellybutton was larger than it had been in the past.  As she rests now, she notices that the bump is no longer there.  She still is in quite a bit of pain though.  She denies diarrhea or GI bleeding.  Just finished her period.  No urinary symptoms.  Independent Historian contributed to assessment above: Her spouse is at bedside to corroborate information past medical history as above    Physical Exam   Triage Vital Signs: ED Triage Vitals [08/19/23 0307]  Encounter Vitals Group     BP 117/70     Systolic BP Percentile      Diastolic BP Percentile      Pulse Rate (!) 112     Resp 18     Temp 98.3 F (36.8 C)     Temp Source Oral     SpO2 100 %     Weight      Height      Head Circumference      Peak Flow      Pain Score 10     Pain Loc      Pain Education      Exclude from Growth Chart     Most recent vital signs: Vitals:   08/19/23 0307 08/19/23 0500  BP: 117/70 103/75  Pulse: (!) 112 93  Resp: 18   Temp: 98.3 F (36.8 C)   SpO2: 100% 100%     General: Awake, no distress.  CV:  Good peripheral perfusion.  Resp:  Normal effort.  Abd:  No distention.  Other:  Appears uncomfortable slightly tachycardic otherwise vital signs are normal.  There is no discernible mass at the bellybutton nor elsewhere on the abdominal exam.  She has mild tenderness periumbilical without rigidity or guarding.  There are no overlying skin changes.   ED Results / Procedures / Treatments   Labs (all labs ordered are listed, but only abnormal results are displayed) Labs Reviewed  COMPREHENSIVE METABOLIC PANEL WITH GFR - Abnormal; Notable for the following components:      Result Value   Potassium 3.1 (*)    Glucose, Bld 125 (*)    Alkaline Phosphatase 27 (*)    All other components within normal limits  CBC - Abnormal; Notable for the following components:   Hemoglobin 11.6 (*)    HCT 34.4 (*)    MCV 77.8 (*)    All other components within normal limits  URINALYSIS, ROUTINE W REFLEX MICROSCOPIC - Abnormal;  Notable for the following components:   Color, Urine YELLOW (*)    APPearance CLEAR (*)    Hgb urine dipstick MODERATE (*)    Ketones, ur 20 (*)    All other components within normal limits  POC URINE PREG, ED     I ordered and reviewed the above labs they are notable for white blood cell count is normal      RADIOLOGY I independently reviewed and interpreted CT of the abdomen pelvis and see what appears to be a hernia around the umbilicus perhaps containing a loop of bowel I also reviewed radiologist's formal read.   PROCEDURES:  Critical Care performed: No  Procedures   MEDICATIONS ORDERED IN ED: Medications  ketorolac  (TORADOL ) 15 MG/ML injection 15 mg (has no administration in time range)  ondansetron  (ZOFRAN ) injection 4 mg (4 mg Intravenous Given 08/19/23 0346)  morphine (PF) 4 MG/ML injection 4 mg (4 mg Intravenous Given 08/19/23 0347)  potassium chloride  SA (KLOR-CON  M) CR tablet 40 mEq (40 mEq Oral Given 08/19/23 0501)   iohexol  (OMNIPAQUE ) 300 MG/ML solution 100 mL (100 mLs Intravenous Contrast Given 08/19/23 0444)     IMPRESSION / MDM / ASSESSMENT AND PLAN / ED COURSE  I reviewed the triage vital signs and the nursing notes.                                Patient's presentation is most consistent with acute presentation with potential threat to life or bodily function.  Differential diagnosis includes, but is not limited to, incarcerated/strangulated hernia, obstruction, appendicitis, diverticulitis, perforated viscus   The patient is on the cardiac monitor to evaluate for evidence of arrhythmia and/or significant heart rate changes.  MDM:    She had a longstanding umbilical hernia that is starting to cause her pain with Valsalva maneuvers lately, with exquisite pain tonight and a large bump resulting from lifting luggage.  The bump is now gone but she has some residual pain.  May have been transient pain related to a hernia that has now self reduced.  However to rule out other potential etiologies like diverticulitis or appendicitis we will get a CT scan of the abdomen and pelvis.  Ultimately if this scan is negative for acute intra-abdominal surgical emergencies tonight, she can be discharged but I will put her in touch with a general surgeon for elective repair of the hernia.  I called CT to let them know to come and get her for the scan as the pregnancy test was indeed negative but it is having trouble crossing over the computer system is resulted.  -- CT scan resulted as a fat-containing umbilical hernia and a small bowel obstruction, low-grade, with transition point apparently in the mid ileum with a thickened wall at the transition.  Patient stable.  Admission.     FINAL CLINICAL IMPRESSION(S) / ED DIAGNOSES   Final diagnoses:  Umbilical hernia without obstruction and without gangrene  Periumbilical abdominal pain  Hypokalemia  Small bowel obstruction (HCC)     Rx / DC Orders   ED  Discharge Orders     None        Note:  This document was prepared using Dragon voice recognition software and may include unintentional dictation errors.    Buell Carmin, MD 08/19/23 [phone]

## 2023-08-19 NOTE — ED Triage Notes (Signed)
 Pt to ED POV from home with lower abdominal pain for the last three hours. Pt states she has had an umbilical hernia for the last year. Pt endorses N/V but denies dizziness. Pt is tearful yelling "Help me I cannot take it" in triage.

## 2023-08-19 NOTE — ED Notes (Signed)
 Advised nurse that patient has ready bed

## 2023-08-19 NOTE — H&P (Signed)
 History and Physical    Victoria Kerr HQI:[id number] DOB: 1991/01/20 DOA: 08/19/2023  PCP: Andreas Bandy, MD (Confirm with patient/family/NH records and if not entered, this has to be entered at [facility] point of entry) Patient coming from: Home  I have personally briefly reviewed patient's old medical records in [facility]  Chief Complaint: Feeling better  HPI: Victoria Kerr is a 33 y.o. female with medical history significant of IIDM, umbilical hernia, migraines, PCOS, obesity, presented with acute onset of abdominal pain.  Patient has history of chronic umbilical hernia, lately, patient has had intermittent abdominal pain, usually happens after cough or lifting heavy stuff, resolved by its own in few minutes.  Yesterday evening, patient started have severe abdominal pain after lifting heavy luggage at home.  The pain is localized around umbilical area, constant associate with nausea but no vomiting.  Denies any history of constipation, no fever or chills.  ED Course: Afebrile, nontachycardic nonhypotensive nonhypoxic.  CT abdomen pelvis showed partial SBO with transition point at mid ileum level, fat tissue around umbilicus hernia area, no entrapment of intestine found.  Blood work showed sodium 138, potassium 3.1, BUN 15 creatinine 0.8 glucose 125, WBC 7.2, hemoglobin 11.6.  Patient was given morphine and IV fluid, symptoms significant improved.  At the time I saw her, she is pain-free and feeling hungry and request some food.  Review of Systems: As per HPI otherwise 14 point review of systems negative.    Past Medical History:  Diagnosis Date   Headache    MIGRAINES   PCOS (polycystic ovarian syndrome) 2013    Past Surgical History:  Procedure Laterality Date   NO PAST SURGERIES       reports that she has never smoked. She has never used smokeless tobacco. She reports current alcohol use. She reports that she does not use drugs.  Allergies  Allergen  Reactions   Penicillins Hives and Rash    Unknown childhood reaction.    Wound Dressing Adhesive Rash    bandaid adhesives    Family History  Problem Relation Age of Onset   Heart attack Mother     Prior to Admission medications   Medication Sig Start Date End Date Taking? Authorizing Provider  cholestyramine (QUESTRAN) 4 g packet Take 4 g by mouth daily at 2 PM. 04/24/23 04/23/24 Yes [provider]  lisinopril-hydrochlorothiazide (ZESTORETIC) 20-12.5 MG tablet Take 1 tablet by mouth daily. 08/18/22 08/19/23 Yes [provider]  metFORMIN (GLUCOPHAGE-XR) 500 MG 24 hr tablet Take 1,000 mg by mouth 2 (two) times daily with a meal. 11/16/22  Yes [provider]  tirzepatide (ZEPBOUND) 7.5 MG/0.5ML Pen Inject 7.5 mg into the skin once a week. 07/19/23  Yes [provider]    Physical Exam: Vitals:   08/19/23 0307 08/19/23 0500 08/19/23 0600 08/19/23 0656  BP: 117/70 103/75 107/84   Pulse: (!) 112 93 85   Resp: 18     Temp: 98.3 F (36.8 C)   98.5 F (36.9 C)  TempSrc: Oral   Oral  SpO2: 100% 100% 99%     Constitutional: NAD, calm, comfortable Vitals:   08/19/23 0307 08/19/23 0500 08/19/23 0600 08/19/23 0656  BP: 117/70 103/75 107/84   Pulse: (!) 112 93 85   Resp: 18     Temp: 98.3 F (36.8 C)   98.5 F (36.9 C)  TempSrc: Oral   Oral  SpO2: 100% 100% 99%    Eyes: PERRL, lids and conjunctivae normal ENMT: Mucous  membranes are moist. Posterior pharynx clear of any exudate or lesions.Normal dentition.  Neck: normal, supple, no masses, no thyromegaly Respiratory: clear to auscultation bilaterally, no wheezing, no crackles. Normal respiratory effort. No accessory muscle use.  Cardiovascular: Regular rate and rhythm, no murmurs / rubs / gallops. No extremity edema. 2+ pedal pulses. No carotid bruits.  Abdomen: no tenderness, no masses palpated. No hepatosplenomegaly. Bowel sounds positive.  Umbilical hernia easily reversible, nontender Musculoskeletal:  no clubbing / cyanosis. No joint deformity upper and lower extremities. Good ROM, no contractures. Normal muscle tone.  Skin: no rashes, lesions, ulcers. No induration Neurologic: CN 2-12 grossly intact. Sensation intact, DTR normal. Strength 5/5 in all 4.  Psychiatric: Normal judgment and insight. Alert and oriented x 3. Normal mood.    Labs on Admission: I have personally reviewed following labs and imaging studies  CBC: Recent Labs  Lab 08/19/23 0310  WBC 7.2  HGB 11.6*  HCT 34.4*  MCV 77.8*  PLT 361   Basic Metabolic Panel: Recent Labs  Lab 08/19/23 0310  NA 138  K 3.1*  CL 105  CO2 22  GLUCOSE 125*  BUN 15  CREATININE 0.85  CALCIUM 9.5   GFR: CrCl cannot be calculated (Unknown ideal weight.). Liver Function Tests: Recent Labs  Lab 08/19/23 0310  AST 16  ALT 13  ALKPHOS 27*  BILITOT 0.6  PROT 7.3  ALBUMIN 4.1   No results for input(s): "LIPASE", "AMYLASE" in the last 168 hours. No results for input(s): "AMMONIA" in the last 168 hours. Coagulation Profile: No results for input(s): "INR", "PROTIME" in the last 168 hours. Cardiac Enzymes: No results for input(s): "CKTOTAL", "CKMB", "CKMBINDEX", "TROPONINI" in the last 168 hours. BNP (last 3 results) No results for input(s): "PROBNP" in the last 8760 hours. HbA1C: No results for input(s): "HGBA1C" in the last 72 hours. CBG: No results for input(s): "GLUCAP" in the last 168 hours. Lipid Profile: No results for input(s): "CHOL", "HDL", "LDLCALC", "TRIG", "CHOLHDL", "LDLDIRECT" in the last 72 hours. Thyroid Function Tests: No results for input(s): "TSH", "T4TOTAL", "FREET4", "T3FREE", "THYROIDAB" in the last 72 hours. Anemia Panel: No results for input(s): "VITAMINB12", "FOLATE", "FERRITIN", "TIBC", "IRON", "RETICCTPCT" in the last 72 hours. Urine analysis:    Component Value Date/Time   COLORURINE YELLOW (A) 08/19/2023 0349   APPEARANCEUR CLEAR (A) 08/19/2023 0349   APPEARANCEUR Clear 04/01/2018 1052    LABSPEC 1.015 08/19/2023 0349   LABSPEC 1.028 11/09/2012 1322   PHURINE 6.0 08/19/2023 0349   GLUCOSEU NEGATIVE 08/19/2023 0349   GLUCOSEU Negative 11/09/2012 1322   HGBUR MODERATE (A) 08/19/2023 0349   BILIRUBINUR NEGATIVE 08/19/2023 0349   BILIRUBINUR neg 05/21/2018 0944   BILIRUBINUR Negative 04/01/2018 1052   BILIRUBINUR Negative 11/09/2012 1322   KETONESUR 20 (A) 08/19/2023 0349   PROTEINUR NEGATIVE 08/19/2023 0349   UROBILINOGEN 0.2 05/21/2018 0944   UROBILINOGEN 1.0 02/16/2012 1733   NITRITE NEGATIVE 08/19/2023 0349   LEUKOCYTESUR NEGATIVE 08/19/2023 0349   LEUKOCYTESUR Negative 11/09/2012 1322    Radiological Exams on Admission: CT ABDOMEN PELVIS W CONTRAST Result Date: 08/19/2023 CLINICAL DATA:  Bilateral lower abdominal pain with umbilical hernia. EXAM: CT ABDOMEN AND PELVIS WITH CONTRAST TECHNIQUE: Multidetector CT imaging of the abdomen and pelvis was performed using the standard protocol following bolus administration of intravenous contrast. RADIATION DOSE REDUCTION: This exam was performed according to the departmental dose-optimization program which includes automated exposure control, adjustment of the mA and/or kV according to patient size and/or use of iterative reconstruction technique. CONTRAST:  OMNIPAQUE  IOHEXOL  300 MG/ML  SOLN COMPARISON:  CT of abdomen and pelvis without contrast 04/11/2020. FINDINGS: Lower chest: There are posterior atelectatic changes in the lower lobes. Lung bases are clear of infiltrates. Mild elevation right hemidiaphragm. The cardiac size is normal. Hepatobiliary: No focal liver abnormality is seen. Status post cholecystectomy. No biliary dilatation. Pancreas: No abnormality. Spleen: No abnormality. Adrenals/Urinary Tract: No abnormality. Stomach/Bowel: The stomach and proximal to mid small bowel are unremarkable. In the mid to lower abdomen there are dilated interspersed with decompressed small bowel segments with dilated segments up to  3.1 cm and a transitional segment with abrupt caliber change on 5:38 and 2:65 with a thickened wall at the transition. Findings are consistent with low to intermediate grade small-bowel obstruction probably somewhere in the mid ileum. The appendix is normal. There is moderate retained stool ascending colon. the rest of the colon is largely decompressed. The wall is unremarkable. Vascular/Lymphatic: No significant vascular findings are present. No enlarged abdominal or pelvic lymph nodes. Reproductive: Uterus and bilateral adnexa are unremarkable. Other: There is a slightly edematous moderate-sized umbilical fat hernia, larger than previously. No bowel entrapment is seen. No free fluid or free air. Musculoskeletal: Left greater than right chronic sacroiliitis. No acute or other significant osseous findings. IMPRESSION: 1. Low to intermediate grade small-bowel obstruction with transition believed to be in the mid ileum, with a thickened wall at the transition. 2. Constipation. 3. Slightly edematous moderate-sized umbilical fat hernia, larger than previously. No bowel entrapment is seen. 4. Left greater than right chronic sacroiliitis. Electronically Signed   By: Denman Fischer M.D.   On: 08/19/2023 05:31    EKG: None  Assessment/Plan Principal Problem:   SBO (small bowel obstruction) (HCC)  (please populate well all problems here in Problem List. (For example, if patient is on BP meds at home and you resume or decide to hold them, it is a problem that needs to be her. Same for CAD, COPD, HLD and so on)  Partial SBO - Probably secondary to reversible umbilical hernia - Discussed with surgeon Dr. Charmel Cooter, who ordered gastricsin study, if no significant residual obstruction expect continue conservative management for today. - NPO, if Gastrografin study negative, expect start diet this morning. - Outpatient follow-up with surgeon for elective umbilical hernia repair  IIDM - Resume metformin  Obesity -  Estimated BMI more than 30 - GLP-1 evaluation  DVT prophylaxis: SCD Code Status: Full code Family Communication: Husband at bedside Disposition Plan: Expect less than 2 midnight hospital Consults called: General Surgery Admission status: MedSurg observation   [place] MD [facility] Pager [phone]  08/19/2023, 8:28 AM

## 2023-08-19 NOTE — Consult Note (Signed)
 SURGICAL CONSULTATION NOTE   HISTORY OF PRESENT ILLNESS (HPI):  33 y.o. female presented to [facility] ED for evaluation of abdominal pain. Patient reports she started having abdominal pain yesterday after lifting her luggage.  She endorses that the pain was localized in the paramedical area.  No pain radiation.  Pain aggravated by lifting.  No alleviating factors.  She endorses associated nausea and vomiting with the pain.  She denies passing gas.  Endorses last bowel movement was before the pain.  At the ED she was found with stable vital signs.  There was pain in the paramedical area.  Labs shows white blood cell count within normal limits.  No significant electrolyte disturbance.  She had a CT scan of the abdomen and pelvis that shows fat containing umbilical hernia but dilated small bowel with transition point in the distal ileum.  Surgery is consulted by Dr. Margery Sheets in this context for evaluation and management of small bowel obstruction.  PAST MEDICAL HISTORY (PMH):  Past Medical History:  Diagnosis Date   Headache    MIGRAINES   PCOS (polycystic ovarian syndrome) 2013     PAST SURGICAL HISTORY (PSH):  Past Surgical History:  Procedure Laterality Date   NO PAST SURGERIES       MEDICATIONS:  Prior to Admission medications   Medication Sig Start Date End Date Taking? Authorizing Provider  cholestyramine (QUESTRAN) 4 g packet Take 4 g by mouth daily at 2 PM. 04/24/23 04/23/24 Yes [provider]  lisinopril-hydrochlorothiazide (ZESTORETIC) 20-12.5 MG tablet Take 1 tablet by mouth daily. 08/18/22 08/19/23 Yes [provider]  metFORMIN (GLUCOPHAGE-XR) 500 MG 24 hr tablet Take 1,000 mg by mouth 2 (two) times daily with a meal. 11/16/22  Yes [provider]  tirzepatide (ZEPBOUND) 7.5 MG/0.5ML Pen Inject 7.5 mg into the skin once a week. 07/19/23  Yes [provider]     ALLERGIES:  Allergies  Allergen Reactions   Penicillins Hives and Rash    Unknown childhood  reaction.    Wound Dressing Adhesive Rash    bandaid adhesives     SOCIAL HISTORY:  Social History   Socioeconomic History   Marital status: Married    Spouse name: Not on file   Number of children: Not on file   Years of education: Not on file   Highest education level: Not on file  Occupational History   Not on file  Tobacco Use   Smoking status: Never   Smokeless tobacco: Never  Vaping Use   Vaping status: Never Used  Substance and Sexual Activity   Alcohol use: Yes    Comment: Occassionally.   Drug use: Never   Sexual activity: Not Currently  Other Topics Concern   Not on file  Social History Narrative   Not on file   Social Drivers of Health   Financial Resource Strain: Low Risk  (08/18/2022)   Received [facility]   Overall Financial Resource Strain (CARDIA)    Difficulty of Paying Living Expenses: Not hard at all  Food Insecurity: No Food Insecurity (01/12/2023)   Received [facility]   Hunger Vital Sign    Worried About Running Out of Food in the Last Year: Never true    Ran Out of Food in the Last Year: Never true  Transportation Needs: No Transportation Needs (01/12/2023)   Received [facility]    Lack of Transportation (Medical): No  Lack of Transportation (Non-Medical): No  Physical Activity: Not on file  Stress: Not on file  Social Connections: Not on file  Intimate Partner Violence: Not on file      FAMILY HISTORY:  Family History  Problem Relation Age of Onset   Heart attack Mother      REVIEW OF SYSTEMS:  Constitutional: denies weight loss, fever, chills, or sweats  Eyes: denies any other vision changes, history of eye injury  ENT: denies sore throat, hearing problems  Respiratory: denies shortness of breath, wheezing  Cardiovascular: denies chest pain, palpitations  Gastrointestinal: Positive abdominal pain, nausea and vomiting Genitourinary: denies burning with  urination or urinary frequency Musculoskeletal: denies any other joint pains or cramps  Skin: denies any other rashes or skin discolorations  Neurological: denies any other headache, dizziness, weakness  Psychiatric: denies any other depression, anxiety   All other review of systems were negative   VITAL SIGNS:  Temp:  [98.3 F (36.8 C)-98.5 F (36.9 C)] 98.5 F (36.9 C) (05/04 0656) Pulse Rate:  [85-112] 85 (05/04 0600) Resp:  [18] 18 (05/04 0307) BP: (103-117)/(70-84) 107/84 (05/04 0600) SpO2:  [99 %-100 %] 99 % (05/04 0600)             INTAKE/OUTPUT:  This shift: No intake/output data recorded.  Last 2 shifts: @IOLAST2SHIFTS @   PHYSICAL EXAM:  Constitutional:  -- Normal body habitus  -- Awake, alert, and oriented x3  Eyes:  -- Pupils equally round and reactive to light  -- No scleral icterus  Ear, nose, and throat:  -- No jugular venous distension  Pulmonary:  -- No crackles  -- Equal breath sounds bilaterally -- Breathing non-labored at rest Cardiovascular:  -- S1, S2 present  -- No pericardial rubs Gastrointestinal:  -- Abdomen soft, nontender, non-distended, no guarding or rebound tenderness -- Palpable umbilical hernia, not completely reducible. Musculoskeletal and Integumentary:  -- Wounds: None appreciated -- Extremities: B/L UE and LE FROM, hands and feet warm, no edema  Neurologic:  -- Motor function: intact and symmetric -- Sensation: intact and symmetric   Labs:     Latest Ref Rng & Units 08/19/2023    3:10 AM 07/30/2023    5:10 PM 09/07/2022    1:59 PM  CBC  WBC 4.0 - 10.5 K/uL 7.2  8.6  7.3   Hemoglobin 12.0 - 15.0 g/dL 29.5  28.4  13.2   Hematocrit 36.0 - 46.0 % 34.4  35.4  38.3   Platelets 150 - 400 K/uL 361  477  393       Latest Ref Rng & Units 08/19/2023    3:10 AM 07/30/2023    5:10 PM 09/07/2022    1:59 PM  CMP  Glucose 70 - 99 mg/dL 440  102  725   BUN 6 - 20 mg/dL 15  11  9    Creatinine 0.44 - 1.00 mg/dL 3.66  4.40  3.47   Sodium  135 - 145 mmol/L 138  137  136   Potassium 3.5 - 5.1 mmol/L 3.1  3.1  3.4   Chloride 98 - 111 mmol/L 105  101  102   CO2 22 - 32 mmol/L 22  26  24    Calcium 8.9 - 10.3 mg/dL 9.5  9.4  9.1   Total Protein 6.5 - 8.1 g/dL 7.3  7.6    Total Bilirubin 0.0 - 1.2 mg/dL 0.6  0.2    Alkaline Phos 38 - 126 U/L 27  31    AST  15 - 41 U/L 16  17    ALT 0 - 44 U/L 13  18      Imaging studies:  EXAM: CT ABDOMEN AND PELVIS WITH CONTRAST   TECHNIQUE: Multidetector CT imaging of the abdomen and pelvis was performed using the standard protocol following bolus administration of intravenous contrast.   RADIATION DOSE REDUCTION: This exam was performed according to the departmental dose-optimization program which includes automated exposure control, adjustment of the mA and/or kV according to patient size and/or use of iterative reconstruction technique.   CONTRAST:  100mL OMNIPAQUE  IOHEXOL  300 MG/ML  SOLN   COMPARISON:  CT of abdomen and pelvis without contrast 04/11/2020.   FINDINGS: Lower chest: There are posterior atelectatic changes in the lower lobes. Lung bases are clear of infiltrates. Mild elevation right hemidiaphragm. The cardiac size is normal.   Hepatobiliary: No focal liver abnormality is seen. Status post cholecystectomy. No biliary dilatation.   Pancreas: No abnormality.   Spleen: No abnormality.   Adrenals/Urinary Tract: No abnormality.   Stomach/Bowel: The stomach and proximal to mid small bowel are unremarkable.   In the mid to lower abdomen there are dilated interspersed with decompressed small bowel segments with dilated segments up to 3.1 cm and a transitional segment with abrupt caliber change on 5:38 and 2:65 with a thickened wall at the transition.   Findings are consistent with low to intermediate grade small-bowel obstruction probably somewhere in the mid ileum.   The appendix is normal. There is moderate retained stool ascending colon. the rest of the  colon is largely decompressed. The wall is unremarkable.   Vascular/Lymphatic: No significant vascular findings are present. No enlarged abdominal or pelvic lymph nodes.   Reproductive: Uterus and bilateral adnexa are unremarkable.   Other: There is a slightly edematous moderate-sized umbilical fat hernia, larger than previously. No bowel entrapment is seen. No free fluid or free air.   Musculoskeletal: Left greater than right chronic sacroiliitis. No acute or other significant osseous findings.   IMPRESSION: 1. Low to intermediate grade small-bowel obstruction with transition believed to be in the mid ileum, with a thickened wall at the transition. 2. Constipation. 3. Slightly edematous moderate-sized umbilical fat hernia, larger than previously. No bowel entrapment is seen. 4. Left greater than right chronic sacroiliitis.     Electronically Signed   By: Denman Fischer M.D.   On: 08/19/2023 05:31  Assessment/Plan:  33 y.o. female with small bowel obstruction, complicated by pertinent comorbidities including umbilical hernia.  Small bowel obstruction -History of bowel obstruction most likely due to adhesions - CT scan not impressive small bowel dilation with distal possible transition point.  This could also be incidental finding in the clinical setting of pain from the periumbilical hernia - Since patient was nauseated and not passing gas we will do Gastrografin challenge.  If Gastrografin reach the large intestine patient can have a diet trial before discharge - IV fluids for hydration - Pain management - Encourage patient to ambulate  Umbilical hernia - Patient has been dealing with this umbilical hernia for years - If she wants to consider having the hernia repair she can follow-up as outpatient for discussion of elective repair.  This should not be repair in an acute setting with possible bowel obstruction  Lucila Rye, MD

## 2023-08-20 NOTE — Discharge Summary (Signed)
 Physician Discharge Summary   Patient: Victoria Kerr MRN: [id number] DOB: 1990/06/16  Admit date:     08/19/2023  Discharge date: 08/19/2023  Discharge Physician: [place]   PCP: Andreas Bandy, MD   Recommendations at discharge:    General surgeon to discuss about elective umbilical hernia repair in 2 weeks  Discharge Diagnoses: Principal Problem:   SBO (small bowel obstruction) [facility])    Hospital Course:  33 year old female patient with past medical history of diabetes umbilical hernia migraines PCOS obesity presented with small bowel obstruction.  Patient has a chronic umbilical hernia and has had intermittent abdominal pain.  Today before admission, patient started to have severe abdominal pain after lifting heavy luggage at home.  Had nausea but no vomiting.  ED workup CT abdomen pelvis showed signs of small bowel obstruction with transition point at the mid ileum area, hiatal hernia with slight enlargement compared to previous study, no bowel entrapment is seen.  After given IV fluid pain medication and Zofran  her symptoms improved.  Patient was evaluated by general surgeon who ordered a Gastrografin study, after ingestion of contrast, patient underwent x-ray and imaging which showed no obstructive bowel gas pattern.  And patient tolerated diet.  Surgeon cleared patient for discharge and recommended outpatient follow-up with surgeon to discuss for elective umbilical hernia repair.  Patient agreed with the plan.       Pain control - [place]  Controlled Substance Reporting System database was reviewed. and patient was instructed, not to drive, operate heavy machinery, perform activities at heights, swimming or participation in water activities or provide baby-sitting services while on Pain, Sleep and Anxiety Medications; until their outpatient Physician has advised to do so again. Also recommended to not to take more than prescribed Pain, Sleep and Anxiety Medications.   Consultants: General surgeon Procedures performed: None Disposition: Home Diet recommendation:  Carb modified diet DISCHARGE MEDICATION: Allergies as of 08/19/2023       Reactions   Penicillins Hives, Rash   Unknown childhood reaction.    Wound Dressing Adhesive Rash   bandaid adhesives        Medication List     TAKE these medications    cholestyramine 4 g packet Commonly known as: QUESTRAN Take 4 g by mouth daily at 2 PM.   lisinopril-hydrochlorothiazide 20-12.5 MG tablet Commonly known as: ZESTORETIC Take 1 tablet by mouth daily.   metFORMIN 500 MG 24 hr tablet Commonly known as: GLUCOPHAGE-XR Take 1,000 mg by mouth 2 (two) times daily with a meal.   Zepbound 7.5 MG/0.5ML Pen Generic drug: tirzepatide Inject 7.5 mg into the skin once a week.        Follow-up Information     Eldred Grego, MD. Schedule an appointment as soon as possible for a visit .   Specialty: General Surgery Contact information: [address] [place] [address] [phone]         Andreas Bandy, MD .   Specialty: Internal Medicine Contact information: [address] [place] [address] [phone]                Discharge Exam: There were no vitals filed for this visit. Eyes: PERRL, lids and conjunctivae normal ENMT: Mucous membranes are moist. Posterior pharynx clear of any exudate or lesions.Normal dentition.  Neck: normal, supple, no masses, no thyromegaly Respiratory: clear to auscultation bilaterally, no wheezing, no crackles. Normal respiratory effort. No accessory muscle use.  Cardiovascular: Regular rate and rhythm, no murmurs /  rubs / gallops. No extremity edema. 2+ pedal pulses. No carotid bruits.  Abdomen: no tenderness, no masses palpated. No hepatosplenomegaly. Bowel sounds positive.  Musculoskeletal: no clubbing / cyanosis. No joint deformity upper and lower extremities. Good ROM, no contractures. Normal muscle  tone.  Skin: no rashes, lesions, ulcers. No induration Neurologic: CN 2-12 grossly intact. Sensation intact, DTR normal.  Muscle strength 5/5 on both sides Psychiatric: Normal judgment and insight. Alert and oriented x 3. Normal mood.     Condition at discharge: good  The results of significant diagnostics from this hospitalization (including imaging, microbiology, ancillary and laboratory) are listed below for reference.   Imaging Studies: DG Abd Portable 1V-Small Bowel Obstruction Protocol-initial, 8 hr delay Result Date: 08/19/2023 CLINICAL DATA:  Small-bowel obstruction EXAM: PORTABLE ABDOMEN - 1 VIEW COMPARISON:  CT from earlier the same day FINDINGS: Stomach is nondilated. Scattered enteric contrast in nondilated small bowel and colon. Regional bones unremarkable. IMPRESSION: Nonobstructive bowel gas pattern. Electronically Signed   By: Nicoletta Barrier M.D.   On: 08/19/2023 17:26   CT ABDOMEN PELVIS W CONTRAST Result Date: 08/19/2023 CLINICAL DATA:  Bilateral lower abdominal pain with umbilical hernia. EXAM: CT ABDOMEN AND PELVIS WITH CONTRAST TECHNIQUE: Multidetector CT imaging of the abdomen and pelvis was performed using the standard protocol following bolus administration of intravenous contrast. RADIATION DOSE REDUCTION: This exam was performed according to the departmental dose-optimization program which includes automated exposure control, adjustment of the mA and/or kV according to patient size and/or use of iterative reconstruction technique. CONTRAST:  OMNIPAQUE  IOHEXOL  300 MG/ML  SOLN COMPARISON:  CT of abdomen and pelvis without contrast 04/11/2020. FINDINGS: Lower chest: There are posterior atelectatic changes in the lower lobes. Lung bases are clear of infiltrates. Mild elevation right hemidiaphragm. The cardiac size is normal. Hepatobiliary: No focal liver abnormality is seen. Status post cholecystectomy. No biliary dilatation. Pancreas: No abnormality. Spleen: No abnormality.  Adrenals/Urinary Tract: No abnormality. Stomach/Bowel: The stomach and proximal to mid small bowel are unremarkable. In the mid to lower abdomen there are dilated interspersed with decompressed small bowel segments with dilated segments up to 3.1 cm and a transitional segment with abrupt caliber change on 5:38 and 2:65 with a thickened wall at the transition. Findings are consistent with low to intermediate grade small-bowel obstruction probably somewhere in the mid ileum. The appendix is normal. There is moderate retained stool ascending colon. the rest of the colon is largely decompressed. The wall is unremarkable. Vascular/Lymphatic: No significant vascular findings are present. No enlarged abdominal or pelvic lymph nodes. Reproductive: Uterus and bilateral adnexa are unremarkable. Other: There is a slightly edematous moderate-sized umbilical fat hernia, larger than previously. No bowel entrapment is seen. No free fluid or free air. Musculoskeletal: Left greater than right chronic sacroiliitis. No acute or other significant osseous findings. IMPRESSION: 1. Low to intermediate grade small-bowel obstruction with transition believed to be in the mid ileum, with a thickened wall at the transition. 2. Constipation. 3. Slightly edematous moderate-sized umbilical fat hernia, larger than previously. No bowel entrapment is seen. 4. Left greater than right chronic sacroiliitis. Electronically Signed   By: Denman Fischer M.D.   On: 08/19/2023 05:31    Microbiology: Results for orders placed or performed during the hospital encounter of 07/30/23  Group A Strep by PCR     Status: None   Collection Time: 07/30/23  5:10 PM   Specimen: Throat; Sterile Swab  Result Value Ref Range Status   Group A Strep by PCR NOT  DETECTED NOT DETECTED Final    Comment: Performed at [facility], [address], [place], [place] [address]    Labs: CBC: Recent Labs  Lab 08/19/23 0310  WBC 7.2  HGB 11.6*  HCT 34.4*   MCV 77.8*  PLT 361   Basic Metabolic Panel: Recent Labs  Lab 08/19/23 0310  NA 138  K 3.1*  CL 105  CO2 22  GLUCOSE 125*  BUN 15  CREATININE 0.85  CALCIUM 9.5   Liver Function Tests: Recent Labs  Lab 08/19/23 0310  AST 16  ALT 13  ALKPHOS 27*  BILITOT 0.6  PROT 7.3  ALBUMIN 4.1   CBG: No results for input(s): "GLUCAP" in the last 168 hours.  Discharge time spent: less than 30 minutes.  Signed: [place], MD [facility] 08/20/2023

## 2023-12-08 ENCOUNTER — Emergency Department

## 2023-12-08 ENCOUNTER — Inpatient Hospital Stay
Admission: EM | Admit: 2023-12-08 | Discharge: 2023-12-09 | DRG: 355 | Disposition: A | Discharge status: Home / Self Care | Attending: Surgery | Admitting: Surgery

## 2023-12-08 ENCOUNTER — Other Ambulatory Visit: Payer: Self-pay

## 2023-12-08 DIAGNOSIS — K436 Other and unspecified ventral hernia with obstruction, without gangrene: Secondary | ICD-10-CM | POA: Diagnosis present

## 2023-12-08 DIAGNOSIS — Z9049 Acquired absence of other specified parts of digestive tract: Secondary | ICD-10-CM | POA: Diagnosis not present

## 2023-12-08 DIAGNOSIS — I10 Essential (primary) hypertension: Secondary | ICD-10-CM | POA: Diagnosis present

## 2023-12-08 DIAGNOSIS — Z8249 Family history of ischemic heart disease and other diseases of the circulatory system: Secondary | ICD-10-CM

## 2023-12-08 DIAGNOSIS — K420 Umbilical hernia with obstruction, without gangrene: Principal | ICD-10-CM

## 2023-12-08 DIAGNOSIS — E282 Polycystic ovarian syndrome: Secondary | ICD-10-CM | POA: Diagnosis present

## 2023-12-08 DIAGNOSIS — Z880 Allergy status to penicillin: Secondary | ICD-10-CM

## 2023-12-08 LAB — URINALYSIS, ROUTINE W REFLEX MICROSCOPIC
Bacteria, UA: NONE SEEN
Bilirubin Urine: NEGATIVE
Glucose, UA: NEGATIVE mg/dL
Ketones, ur: NEGATIVE mg/dL
Leukocytes,Ua: NEGATIVE
Nitrite: NEGATIVE
Protein, ur: NEGATIVE mg/dL
Specific Gravity, Urine: 1.019 (ref 1.005–1.030)
pH: 5 (ref 5.0–8.0)

## 2023-12-08 LAB — COMPREHENSIVE METABOLIC PANEL WITH GFR
ALT: 11 U/L (ref 0–44)
AST: 15 U/L (ref 15–41)
Albumin: 4.4 g/dL (ref 3.5–5.0)
Alkaline Phosphatase: 27 U/L — ABNORMAL LOW (ref 38–126)
Anion gap: 12 (ref 5–15)
BUN: 7 mg/dL (ref 6–20)
CO2: 24 mmol/L (ref 22–32)
Calcium: 9.5 mg/dL (ref 8.9–10.3)
Chloride: 104 mmol/L (ref 98–111)
Creatinine, Ser: 0.78 mg/dL (ref 0.44–1.00)
GFR, Estimated: >60 mL/min (ref >60)
Glucose, Bld: 86 mg/dL (ref 70–99)
Potassium: 3.6 mmol/L (ref 3.5–5.1)
Sodium: 140 mmol/L (ref 135–145)
Total Bilirubin: 0.8 mg/dL (ref 0.0–1.2)
Total Protein: 7.6 g/dL (ref 6.5–8.1)

## 2023-12-08 LAB — POC URINE PREG, ED: Preg Test, Ur: NEGATIVE

## 2023-12-08 LAB — CBC
HCT: 37.4 % (ref 36.0–46.0)
Hemoglobin: 12.5 g/dL (ref 12.0–15.0)
MCH: 26.9 pg (ref 26.0–34.0)
MCHC: 33.4 g/dL (ref 30.0–36.0)
MCV: 80.4 fL (ref 80.0–100.0)
Platelets: 335 K/uL (ref 150–400)
RBC: 4.65 MIL/uL (ref 3.87–5.11)
RDW: 12.7 % (ref 11.5–15.5)
WBC: 4.4 K/uL (ref 4.0–10.5)
nRBC: 0 % (ref 0.0–0.2)

## 2023-12-08 LAB — LIPASE, BLOOD: Lipase: 48 U/L (ref 11–51)

## 2023-12-08 MED ORDER — KETOROLAC TROMETHAMINE 30 MG/ML IJ SOLN
30.0000 mg | Freq: Once | INTRAMUSCULAR | Status: AC
  Administered 2023-12-08: 30 mg via INTRAVENOUS
  Filled 2023-12-08: qty 1

## 2023-12-08 MED ORDER — MELATONIN 5 MG PO TABS: 5.0000 mg | ORAL_TABLET | Freq: Every evening | ORAL | Status: DC | PRN

## 2023-12-08 MED ORDER — KETOROLAC TROMETHAMINE 30 MG/ML IJ SOLN: 30.0000 mg | Freq: Four times a day (QID) | INTRAMUSCULAR | Status: DC | PRN

## 2023-12-08 MED ORDER — HYDRALAZINE HCL 20 MG/ML IJ SOLN: 10.0000 mg | INTRAMUSCULAR | Status: DC | PRN

## 2023-12-08 MED ORDER — DIPHENHYDRAMINE HCL 50 MG/ML IJ SOLN: 12.5000 mg | Freq: Four times a day (QID) | INTRAMUSCULAR | Status: DC | PRN

## 2023-12-08 MED ORDER — ACETAMINOPHEN 500 MG PO TABS
1000.0000 mg | ORAL_TABLET | Freq: Four times a day (QID) | ORAL | Status: DC
  Administered 2023-12-09: 1000 mg via ORAL
  Filled 2023-12-08: qty 2

## 2023-12-08 MED ORDER — ONDANSETRON HCL 4 MG/2ML IJ SOLN: 4.0000 mg | Freq: Four times a day (QID) | INTRAMUSCULAR | Status: DC | PRN

## 2023-12-08 MED ORDER — SODIUM CHLORIDE 0.9 % IV SOLN: INTRAVENOUS | Status: DC

## 2023-12-08 MED ORDER — INSULIN ASPART 100 UNIT/ML IJ SOLN: 0.0000 [IU] | Freq: Every day | INTRAMUSCULAR | Status: DC

## 2023-12-08 MED ORDER — SODIUM CHLORIDE 0.9 % IV BOLUS
1000.0000 mL | Freq: Once | INTRAVENOUS | Status: AC
  Administered 2023-12-08: 1000 mL via INTRAVENOUS

## 2023-12-08 MED ORDER — ONDANSETRON 4 MG PO TBDP: 4.0000 mg | ORAL_TABLET | Freq: Four times a day (QID) | ORAL | Status: DC | PRN

## 2023-12-08 MED ORDER — INSULIN ASPART 100 UNIT/ML IJ SOLN: 0.0000 [IU] | Freq: Three times a day (TID) | INTRAMUSCULAR | Status: DC

## 2023-12-08 MED ORDER — MORPHINE SULFATE (PF) 4 MG/ML IV SOLN
4.0000 mg | Freq: Once | INTRAVENOUS | Status: AC
  Administered 2023-12-08: 4 mg via INTRAVENOUS
  Filled 2023-12-08: qty 1

## 2023-12-08 MED ORDER — ONDANSETRON HCL 4 MG/2ML IJ SOLN
4.0000 mg | Freq: Once | INTRAMUSCULAR | Status: AC
  Administered 2023-12-08: 4 mg via INTRAVENOUS
  Filled 2023-12-08: qty 2

## 2023-12-08 MED ORDER — INSULIN ASPART 100 UNIT/ML IJ SOLN: 3.0000 [IU] | Freq: Three times a day (TID) | INTRAMUSCULAR | Status: DC

## 2023-12-08 MED ORDER — CEFAZOLIN SODIUM-DEXTROSE 2-4 GM/100ML-% IV SOLN
2.0000 g | INTRAVENOUS | Status: AC
  Administered 2023-12-09: 2 g via INTRAVENOUS
  Filled 2023-12-08: qty 100

## 2023-12-08 MED ORDER — IOHEXOL 300 MG/ML  SOLN
100.0000 mL | Freq: Once | INTRAMUSCULAR | Status: AC | PRN
  Administered 2023-12-08: 100 mL via INTRAVENOUS

## 2023-12-08 MED ORDER — ENOXAPARIN SODIUM 40 MG/0.4ML IJ SOSY: 40.0000 mg | PREFILLED_SYRINGE | INTRAMUSCULAR | Status: DC

## 2023-12-08 MED ORDER — PANTOPRAZOLE SODIUM 40 MG IV SOLR
40.0000 mg | Freq: Two times a day (BID) | INTRAVENOUS | Status: DC
  Administered 2023-12-08 – 2023-12-09 (×2): 40 mg via INTRAVENOUS
  Filled 2023-12-08 (×2): qty 10

## 2023-12-08 MED ORDER — OXYCODONE HCL 5 MG PO TABS
5.0000 mg | ORAL_TABLET | ORAL | Status: DC | PRN
  Administered 2023-12-09: 5 mg via ORAL
  Filled 2023-12-08: qty 1

## 2023-12-08 MED ORDER — DIPHENHYDRAMINE HCL 12.5 MG/5ML PO ELIX: 12.5000 mg | ORAL_SOLUTION | Freq: Four times a day (QID) | ORAL | Status: DC | PRN

## 2023-12-08 MED ORDER — MORPHINE SULFATE (PF) 2 MG/ML IV SOLN: 2.0000 mg | INTRAVENOUS | Status: DC | PRN

## 2023-12-08 NOTE — ED Triage Notes (Signed)
 Pt to ED for abd pain started today at 10. Reports has hernia. +emesis.

## 2023-12-08 NOTE — Plan of Care (Signed)

## 2023-12-08 NOTE — Progress Notes (Signed)
 Presents w acute incarcerated ventral hernia , Dr. SHAUNNA ER physician was able to reduce it, she still sore and given size of defect and recurrent sxs she wishes to stay and have the defect fix, I do agree with this approach as it is the safest

## 2023-12-08 NOTE — ED Provider Notes (Signed)
 [facility] Provider Note    Event Date/Time   First MD Initiated Contact with Patient 12/08/23 1254     (approximate)  History   Chief Complaint: Abdominal Pain  HPI  Shanyiah Conde is a 33 y.o. female with a past medical history of headaches, migraines, presents to the emergency department for abdominal pain nausea and vomiting.  According to the patient she has an umbilical hernia known to her.  States she was told to follow-up with surgery but she has not done so yet.  States normally does not bother her unless she is straining to pick something up, etc.  Patient states since around 10:00 this morning however she has had progressive worsening pain to this area.  States her last bowel movement was yesterday, began feeling nauseated and had an episode of vomiting prior to arrival.  No urinary symptoms.  No fever.  Physical Exam   Triage Vital Signs: ED Triage Vitals  Encounter Vitals Group     BP 12/08/23 1235 (!) 125/95     Girls Systolic BP Percentile --      Girls Diastolic BP Percentile --      Boys Systolic BP Percentile --      Boys Diastolic BP Percentile --      Pulse Rate 12/08/23 1235 (!) 104     Resp 12/08/23 1235 18     Temp 12/08/23 1236 97.9 F (36.6 C)     Temp src --      SpO2 12/08/23 1235 100 %     Weight 12/08/23 1235 158 lb (71.7 kg)     Height 12/08/23 1235 5' 9 (1.753 m)     Head Circumference --      Peak Flow --      Pain Score 12/08/23 1235 7     Pain Loc --      Pain Education --      Exclude from Growth Chart --     Most recent vital signs: Vitals:   12/08/23 1235 12/08/23 1236  BP: (!) 125/95   Pulse: (!) 104   Resp: 18   Temp:  97.9 F (36.6 C)  SpO2: 100%     General: Awake, no distress.  CV:  Good peripheral perfusion.  Regular rate and rhythm  Resp:  Normal effort.  Equal breath sounds bilaterally.  Abd:  No distention.  Soft, moderate tenderness palpation over a palpable periumbilical mass  consistent with a small umbilical hernia.  Abdomen otherwise benign.  No rebound or guarding.  No distention.   ED Results / Procedures / Treatments   RADIOLOGY  I have reviewed and interpreted the CT images CT appears to show intestinal loop within the hernia possibly with dilation proximally.  Concerning for possible incarcerated hernia leading to obstruction.   MEDICATIONS ORDERED IN ED: Medications  morphine  (PF) 4 MG/ML injection 4 mg (4 mg Intravenous Given 12/08/23 1408)  ondansetron  (ZOFRAN ) injection 4 mg (4 mg Intravenous Given 12/08/23 1407)  iohexol  (OMNIPAQUE ) 300 MG/ML solution 100 mL (100 mLs Intravenous Contrast Given 12/08/23 1352)     IMPRESSION / MDM / ASSESSMENT AND PLAN / ED COURSE  I reviewed the triage vital signs and the nursing notes.  Patient's presentation is most consistent with acute presentation with potential threat to life or bodily function.  Patient presents emergency department for worsening abdominal pain has a palpable umbilical hernia.  Nausea vomiting this morning.  Patient's lab work today shows a reassuring CBC chemistry  normal LFTs lipase negative pregnancy test, normal urinalysis.  CT scan appears to show incarcerated hernia possibly with obstruction.  Unable to reduce at bedside.  Will dose pain medication and attempt reduction again.  If still unable to reduce patient will require surgical consultation.  CT confirms small bowel obstruction due to strangulated hernia.  Patient's lab work is reassuring with normal CBC chemistry lipase urinalysis negative pregnancy test.  After placing the patient in Trendelenburg position and applying some pressure I was able to get the hernia to reduce.  However patient is concerned over recurrence as this has recurred several times recently and seems to be getting worse.  I spoke to Dr. Jordis of general surgery who will be admitting for hernia repair.  FINAL CLINICAL IMPRESSION(S) / ED DIAGNOSES   Incarcerated  umbilical hernia Small bowel obstruction Note:  This document was prepared using Dragon voice recognition software and may include unintentional dictation errors.   Dorothyann Drivers, MD 12/08/23 1525

## 2023-12-09 ENCOUNTER — Other Ambulatory Visit: Payer: Self-pay

## 2023-12-09 ENCOUNTER — Encounter
Admission: EM | Disposition: A | Discharge status: Home / Self Care | Payer: Self-pay | Source: Home / Self Care | Attending: Surgery

## 2023-12-09 ENCOUNTER — Inpatient Hospital Stay: Admitting: Registered Nurse

## 2023-12-09 DIAGNOSIS — K436 Other and unspecified ventral hernia with obstruction, without gangrene: Secondary | ICD-10-CM | POA: Diagnosis not present

## 2023-12-09 HISTORY — PX: VENTRAL HERNIA REPAIR: SHX424

## 2023-12-09 LAB — BASIC METABOLIC PANEL WITH GFR
Anion gap: 6 (ref 5–15)
BUN: 6 mg/dL (ref 6–20)
CO2: 22 mmol/L (ref 22–32)
Calcium: 8.6 mg/dL — ABNORMAL LOW (ref 8.9–10.3)
Chloride: 110 mmol/L (ref 98–111)
Creatinine, Ser: 0.78 mg/dL (ref 0.44–1.00)
GFR, Estimated: >60 mL/min (ref >60)
Glucose, Bld: 79 mg/dL (ref 70–99)
Potassium: 3.2 mmol/L — ABNORMAL LOW (ref 3.5–5.1)
Sodium: 138 mmol/L (ref 135–145)

## 2023-12-09 LAB — HEMOGLOBIN A1C
Hgb A1c MFr Bld: 5.4 % (ref 4.8–5.6)
Mean Plasma Glucose: 108.28 mg/dL

## 2023-12-09 LAB — CBC
HCT: 33.7 % — ABNORMAL LOW (ref 36.0–46.0)
Hemoglobin: 11.4 g/dL — ABNORMAL LOW (ref 12.0–15.0)
MCH: 27 pg (ref 26.0–34.0)
MCHC: 33.8 g/dL (ref 30.0–36.0)
MCV: 79.9 fL — ABNORMAL LOW (ref 80.0–100.0)
Platelets: 278 K/uL (ref 150–400)
RBC: 4.22 MIL/uL (ref 3.87–5.11)
RDW: 12.7 % (ref 11.5–15.5)
WBC: 4.1 K/uL (ref 4.0–10.5)
nRBC: 0 % (ref 0.0–0.2)

## 2023-12-09 LAB — MAGNESIUM: Magnesium: 2 mg/dL (ref 1.7–2.4)

## 2023-12-09 SURGERY — REPAIR, HERNIA, VENTRAL
Anesthesia: General | Site: Abdomen

## 2023-12-09 MED ORDER — OXYCODONE HCL 5 MG PO TABS: 5.0000 mg | ORAL_TABLET | Freq: Once | ORAL | Status: DC | PRN

## 2023-12-09 MED ORDER — PHENYLEPHRINE 80 MCG/ML (10ML) SYRINGE FOR IV PUSH (FOR BLOOD PRESSURE SUPPORT)
PREFILLED_SYRINGE | INTRAVENOUS | Status: DC | PRN
  Administered 2023-12-09 (×2): 80 ug via INTRAVENOUS

## 2023-12-09 MED ORDER — DEXAMETHASONE SODIUM PHOSPHATE 10 MG/ML IJ SOLN
INTRAMUSCULAR | Status: DC | PRN
  Administered 2023-12-09: 10 mg via INTRAVENOUS

## 2023-12-09 MED ORDER — ONDANSETRON HCL 4 MG/2ML IJ SOLN
INTRAMUSCULAR | Status: AC
  Filled 2023-12-09: qty 2

## 2023-12-09 MED ORDER — MIDAZOLAM HCL 2 MG/2ML IJ SOLN
INTRAMUSCULAR | Status: DC | PRN
  Administered 2023-12-09: 2 mg via INTRAVENOUS

## 2023-12-09 MED ORDER — ROCURONIUM BROMIDE 100 MG/10ML IV SOLN
INTRAVENOUS | Status: DC | PRN
  Administered 2023-12-09: 50 mg via INTRAVENOUS

## 2023-12-09 MED ORDER — FENTANYL CITRATE (PF) 100 MCG/2ML IJ SOLN
INTRAMUSCULAR | Status: DC | PRN
  Administered 2023-12-09 (×4): 50 ug via INTRAVENOUS

## 2023-12-09 MED ORDER — ACETAMINOPHEN 10 MG/ML IV SOLN
INTRAVENOUS | Status: DC | PRN
Start: 2023-12-09 — End: 2023-12-09
  Administered 2023-12-09: 1000 mg via INTRAVENOUS

## 2023-12-09 MED ORDER — MIDAZOLAM HCL 2 MG/2ML IJ SOLN
INTRAMUSCULAR | Status: AC
  Filled 2023-12-09: qty 2

## 2023-12-09 MED ORDER — OXYCODONE HCL 5 MG/5ML PO SOLN: 5.0000 mg | Freq: Once | ORAL | Status: DC | PRN

## 2023-12-09 MED ORDER — BUPIVACAINE-EPINEPHRINE (PF) 0.25% -1:200000 IJ SOLN
INTRAMUSCULAR | Status: DC | PRN
  Administered 2023-12-09: 30 mL via PERINEURAL

## 2023-12-09 MED ORDER — 0.9 % SODIUM CHLORIDE (POUR BTL) OPTIME
TOPICAL | Status: DC | PRN
  Administered 2023-12-09: 1000 mL

## 2023-12-09 MED ORDER — KETOROLAC TROMETHAMINE 30 MG/ML IJ SOLN
30.0000 mg | Freq: Four times a day (QID) | INTRAMUSCULAR | Status: DC
  Administered 2023-12-09: 30 mg via INTRAVENOUS
  Filled 2023-12-09: qty 1

## 2023-12-09 MED ORDER — BUPIVACAINE-EPINEPHRINE (PF) 0.25% -1:200000 IJ SOLN
INTRAMUSCULAR | Status: AC
  Filled 2023-12-09: qty 30

## 2023-12-09 MED ORDER — CHLORHEXIDINE GLUCONATE CLOTH 2 % EX PADS: 6.0000 | MEDICATED_PAD | Freq: Every day | CUTANEOUS | Status: DC

## 2023-12-09 MED ORDER — LIDOCAINE HCL (PF) 2 % IJ SOLN
INTRAMUSCULAR | Status: AC
  Filled 2023-12-09: qty 5

## 2023-12-09 MED ORDER — ACETAMINOPHEN 10 MG/ML IV SOLN: 1000.0000 mg | Freq: Once | INTRAVENOUS | Status: DC | PRN

## 2023-12-09 MED ORDER — FENTANYL CITRATE (PF) 100 MCG/2ML IJ SOLN
INTRAMUSCULAR | Status: AC
  Filled 2023-12-09: qty 2

## 2023-12-09 MED ORDER — DEXAMETHASONE SODIUM PHOSPHATE 10 MG/ML IJ SOLN
INTRAMUSCULAR | Status: AC
  Filled 2023-12-09: qty 1

## 2023-12-09 MED ORDER — ONDANSETRON HCL 4 MG/2ML IJ SOLN
INTRAMUSCULAR | Status: DC | PRN
  Administered 2023-12-09: 4 mg via INTRAVENOUS

## 2023-12-09 MED ORDER — PHENYLEPHRINE 80 MCG/ML (10ML) SYRINGE FOR IV PUSH (FOR BLOOD PRESSURE SUPPORT)
PREFILLED_SYRINGE | INTRAVENOUS | Status: AC
  Filled 2023-12-09: qty 10

## 2023-12-09 MED ORDER — PROPOFOL 10 MG/ML IV BOLUS
INTRAVENOUS | Status: DC | PRN
  Administered 2023-12-09: 170 mg via INTRAVENOUS

## 2023-12-09 MED ORDER — PROPOFOL 10 MG/ML IV BOLUS
INTRAVENOUS | Status: AC
  Filled 2023-12-09: qty 20

## 2023-12-09 MED ORDER — ACETAMINOPHEN 10 MG/ML IV SOLN
INTRAVENOUS | Status: AC
  Filled 2023-12-09: qty 100

## 2023-12-09 MED ORDER — LIDOCAINE HCL (CARDIAC) PF 100 MG/5ML IV SOSY
PREFILLED_SYRINGE | INTRAVENOUS | Status: DC | PRN
  Administered 2023-12-09: 60 mg via INTRAVENOUS

## 2023-12-09 MED ORDER — LACTATED RINGERS IV SOLN: INTRAVENOUS | Status: DC | PRN

## 2023-12-09 MED ORDER — DEXMEDETOMIDINE HCL IN NACL 80 MCG/20ML IV SOLN
INTRAVENOUS | Status: DC | PRN
  Administered 2023-12-09: 8 ug via INTRAVENOUS

## 2023-12-09 MED ORDER — HYDROCODONE-ACETAMINOPHEN 5-325 MG PO TABS: 1.0000 | ORAL_TABLET | Freq: Four times a day (QID) | ORAL | 0 refills | Status: DC | PRN

## 2023-12-09 MED ORDER — DROPERIDOL 2.5 MG/ML IJ SOLN: 0.6250 mg | Freq: Once | INTRAMUSCULAR | Status: DC | PRN

## 2023-12-09 MED ORDER — FENTANYL CITRATE (PF) 100 MCG/2ML IJ SOLN
25.0000 ug | INTRAMUSCULAR | Status: DC | PRN
  Administered 2023-12-09: 25 ug via INTRAVENOUS

## 2023-12-09 MED ORDER — SUGAMMADEX SODIUM 200 MG/2ML IV SOLN
INTRAVENOUS | Status: DC | PRN
  Administered 2023-12-09: 200 mg via INTRAVENOUS

## 2023-12-09 SURGICAL SUPPLY — 29 items
BLADE CLIPPER SURG (BLADE) IMPLANT
CHLORAPREP W/TINT 26 (MISCELLANEOUS) IMPLANT
CLIP APPLIE 11 MED OPEN (CLIP) IMPLANT
CLIP APPLIE 13 LRG OPEN (CLIP) IMPLANT
DERMABOND ADVANCED .7 DNX12 (GAUZE/BANDAGES/DRESSINGS) IMPLANT
DERMABOND ADVANCED .7 DNX6 (GAUZE/BANDAGES/DRESSINGS) IMPLANT
DRAPE LAPAROTOMY 100X77 ABD (DRAPES) ×1 IMPLANT
DRSG OPSITE POSTOP 4X10 (GAUZE/BANDAGES/DRESSINGS) IMPLANT
DRSG OPSITE POSTOP 4X8 (GAUZE/BANDAGES/DRESSINGS) IMPLANT
ELECT CAUTERY BLADE 6.4 (BLADE) ×1 IMPLANT
ELECTRODE REM PT RTRN 9FT ADLT (ELECTROSURGICAL) ×1 IMPLANT
GLOVE BIO SURGEON STRL SZ7 (GLOVE) ×1 IMPLANT
GOWN STRL REUS W/ TWL LRG LVL3 (GOWN DISPOSABLE) ×2 IMPLANT
KIT TURNOVER KIT A (KITS) ×1 IMPLANT
MANIFOLD NEPTUNE II (INSTRUMENTS) ×1 IMPLANT
MESH VENTRALEX ST 2.5 CRC MED (Mesh General) IMPLANT
NDL FRENCH SPRG EYE 1/2 CRC (NEEDLE) IMPLANT
NDL HYPO 22X1.5 SAFETY MO (MISCELLANEOUS) ×1 IMPLANT
NEEDLE HYPO 22X1.5 SAFETY MO (MISCELLANEOUS) ×1 IMPLANT
PACK BASIN MINOR ARMC (MISCELLANEOUS) ×1 IMPLANT
SPONGE T-LAP 18X18 ~~LOC~~+RFID (SPONGE) ×1 IMPLANT
STAPLER SKIN PROX 35W (STAPLE) IMPLANT
SUT ETHIBOND 0 MO6 C/R (SUTURE) ×1 IMPLANT
SUT VIC AB 2-0 SH 27XBRD (SUTURE) ×2 IMPLANT
SUTURE MNCRL 4-0 27XMF (SUTURE) IMPLANT
SYR 20ML LL LF (SYRINGE) ×2 IMPLANT
TRAP FLUID SMOKE EVACUATOR (MISCELLANEOUS) ×1 IMPLANT
WATER STERILE IRR 1000ML POUR (IV SOLUTION) ×1 IMPLANT
WATER STERILE IRR 500ML POUR (IV SOLUTION) ×1 IMPLANT

## 2023-12-09 NOTE — Plan of Care (Signed)
   Problem: Education: Goal: Knowledge of General Education information will improve Description: Including pain rating scale, medication(s)/side effects and non-pharmacologic comfort measures Outcome: Progressing   Problem: Clinical Measurements: Goal: Ability to maintain clinical measurements within normal limits will improve Outcome: Progressing Goal: Will remain free from infection Outcome: Progressing

## 2023-12-09 NOTE — Discharge Instructions (Signed)
 Open Hernia Repair, Adult Open hernia repair is surgery to fix a hernia. A hernia happens when part of your body pushes out through a weak spot in the muscles of your belly. This makes a bulge. You may need surgery if the bulge can't be pushed back into place and: The contents of your belly are stuck in the opening. The blood supply is cut off. The hernia gets bigger and causes more discomfort. Tell a health care provider about: Any allergies you have. All medicines you take. These include vitamins, herbs, eye drops, and creams you use. Any problems you or family members have had with anesthesia. Any medical conditions you have. These include blood and bone problems. Any surgeries you've had. Any recent cold or flu symptoms. Whether you're pregnant or may be pregnant. What are the risks? Your health care provider will talk with you about risks. These may include: Bleeding. Infection. Damage to nearby structures or organs. These include the bladder, blood vessels, intestines, nerves, or testicles. Blood clots in your legs or lungs. Trouble peeing. The hernia coming back. What happens before the surgery? When to stop eating and drinking Follow instructions about what you may eat and drink. These may include: 8 hours before your surgery Stop eating most foods. Do not eat meat, fried foods, or fatty foods. Eat only light foods, such as toast or crackers. All liquids are okay except energy drinks and alcohol. 6 hours before your surgery Stop eating. Drink only clear liquids, such as water, clear fruit juice, black coffee, plain tea, and sports drinks. Do not drink energy drinks or alcohol. 2 hours before your surgery Stop drinking all liquids. You may be allowed to take medicines with small sips of water. If you don't follow your provider's instructions, your surgery may be delayed or canceled. Medicines Ask about changing or stopping: Any medicines you take. Any vitamins, herbs,  or supplements you take. Do not take aspirin or ibuprofen unless you're told to. Surgery safety For your safety, your provider may: Remove hair at the surgery site. Ask you to wash your skin with a soap that kills germs. Give you antibiotics. Tests You may have exams or tests. These may include: Blood tests. Imaging tests. General instructions Do not smoke, vape, or use any products that have nicotine or tobacco for at least 4 weeks before the surgery. If you need help quitting, talk with your provider. Ask if you'll be staying overnight in the hospital. If you'll be going home right after the surgery, plan to have a responsible adult: Drive you home from the hospital or clinic. You won't be allowed to drive. Stay with you for the time you're told. What happens during the surgery? An IV will be put into a vein in your hand or arm. You will be given: A sedative. This helps you relax. Anesthesia. This keeps you from feeling pain. It will make you fall asleep for surgery. A cut will be made over the hernia. The bulge will be put back into place. The edges of the hernia may be stitched together. The opening in the belly muscles will be closed with stitches. In some cases, a mesh patch may be put over the opening instead. The cut from surgery will be closed with stitches, skin glue, or tape strips. A bandage may be put over it. The surgery may vary among providers and hospitals. What happens after the surgery? You'll be watched closely until you leave. This includes checking your blood pressure, heart rate,  breathing rate, and blood oxygen level. You may be given medicine to help with pain. This information is not intended to replace advice given to you by your health care provider. Make sure you discuss any questions you have with your health care provider. Document Revised: 01/04/2023 Document Reviewed: 07/14/2022 Elsevier Patient Education  2024 [employer].

## 2023-12-09 NOTE — Progress Notes (Signed)
 Discharge instructions reviewed with the patient and her husband. IV removed. Patient sent out by wheelchair with belongings to her waiting ride

## 2023-12-09 NOTE — Progress Notes (Signed)
Patient transported to surgery. 

## 2023-12-09 NOTE — Op Note (Signed)
  Ventral Hernia Repair 3 cms  with 6.4 cms Ventralex Mesh   Pre-operative Diagnosis: incarcerated ventral hernia  Post-operative Diagnosis: same  Surgeon: Laneta Luna, MD FACS  Anesthesia: Gen. with endotracheal tube   Findings:  3cms  chronically incarcerated omentum No evidence of bowel incarceration  Estimated Blood Loss: 10cc                Specimens: sac          Complications: none              Procedure Details  The patient was seen again in the Holding Room. The benefits, complications, treatment options, and expected outcomes were discussed with the patient. The risks of bleeding, infection, recurrence of symptoms, failure to resolve symptoms, bowel injury, mesh placement, mesh infection, any of which could require further surgery were reviewed with the patient. The likelihood of improving the patient's symptoms with return to their baseline status is good.  The patient and/or family concurred with the proposed plan, giving informed consent.  The patient was taken to Operating Room, identified and the procedure verified.  A Time Out was held and the above information confirmed.  Prior to the induction of general anesthesia, antibiotic prophylaxis was administered. VTE prophylaxis was in place. General endotracheal anesthesia was then administered and tolerated well. After the induction, the abdomen was prepped with Chloraprep and draped in the sterile fashion. The patient was positioned in the supine position.  Incision was created with a scalpel over the hernia defect. Electrocautery was used to dissect through subcutaneous tissue, the hernia sac was opened excised.Omentum was found chronically incarcerated and was reduced back to the abdominal cavity. We were able to clean the fascial edges and lyse some omental adhesions that were attached the abdominal wall, no bowel injuries observed.  Marcaine  was used to injected bilaterally to perform TAP Block under bidigital  guidance.  The hernia was measured. A BARD 6.4 cm Ventralex  patch selected and placed in an underlay fashion . I used 6 transfacial sutures to  secure to the fascia circumferentially. The mesh laid very nicely against the abdominal wall w/o any kinks or wrinkles. I closed the hernia defect with interrupted 0 Ethibond sutures.   Incision was closed in a 2 layer fashion with 3-0 Vicryl and 4-0 Monocryl. Dermabond was used to coat the skin. Patient tolerated procedure well and there were no immediate complications. Needle and laparotomy counts were correct

## 2023-12-09 NOTE — Anesthesia Procedure Notes (Signed)
 Procedure Name: Intubation Date/Time: 12/09/2023 12:27 PM  Performed by: Erie Jyl MATSU, CRNAPre-anesthesia Checklist: Patient identified, Patient being monitored, Timeout performed, Emergency Drugs available and Suction available Patient Re-evaluated:Patient Re-evaluated prior to induction Oxygen Delivery Method: Circle system utilized Preoxygenation: Pre-oxygenation with 100% oxygen Induction Type: IV induction Ventilation: Mask ventilation without difficulty Laryngoscope Size: 3 and McGrath Grade View: Grade I Tube type: Oral Tube size: 7.0 mm Number of attempts: 1 Airway Equipment and Method: Stylet Placement Confirmation: ETT inserted through vocal cords under direct vision, positive ETCO2 and breath sounds checked- equal and bilateral Secured at: 21 cm Tube secured with: Tape Dental Injury: Teeth and Oropharynx as per pre-operative assessment

## 2023-12-09 NOTE — H&P (Signed)
 Patient ID: Victoria Kerr, female   DOB: Jun 05, 1990, 33 y.o.   MRN: [id number]  HPI Sallee Hogrefe is a 33 y.o. female in consultation at the request of Dr.P.  He came in with acute abdominal pain located in the periumbilical area moderate to severe worsening with certain movements.  She underwent appropriate workup including a CT scan of the abdomen pelvis I personally reviewed showing evidence of a trapped small bowel within hernia with no evidence of perforation.  ER physician was able to reduce it but she still had some pain and some chronic incarceration.  He Prior cholecystectomy 3 years ago CBC and BMP nml. She feels ok today but still sore   HPI  Past Medical History:  Diagnosis Date   Headache    MIGRAINES   PCOS (polycystic ovarian syndrome) 2013    Cholecystectomy 3 years ago  Family History  Problem Relation Age of Onset   Heart attack Mother     Social History Social History   Tobacco Use   Smoking status: Never   Smokeless tobacco: Never  Vaping Use   Vaping status: Never Used  Substance Use Topics   Alcohol use: Yes    Comment: Occassionally.   Drug use: Never    Allergies  Allergen Reactions   Penicillins Hives and Rash    Unknown childhood reaction.    Wound Dressing Adhesive Rash    bandaid adhesives    Current Facility-Administered Medications  Medication Dose Route Frequency Provider Last Rate Last Admin   0.9 %  sodium chloride  infusion   Intravenous Continuous Jakiera Ehler, [place] F, MD 100 mL/hr at 12/09/23 0807 New Bag at 12/09/23 0807   acetaminophen  (TYLENOL ) tablet 1,000 mg  1,000 mg Oral Q6H Richard Holz F, MD       ceFAZolin  (ANCEF ) IVPB 2g/100 mL premix  2 g Intravenous On Call to OR Quintavius Niebuhr F, MD       diphenhydrAMINE  (BENADRYL ) 12.5 MG/5ML elixir 12.5 mg  12.5 mg Oral Q6H PRN Solace Manwarren F, MD       Or   diphenhydrAMINE  (BENADRYL ) injection 12.5 mg  12.5 mg Intravenous Q6H PRN Azaylea Maves F, MD       enoxaparin   (LOVENOX ) injection 40 mg  40 mg Subcutaneous Q24H Sadao Weyer F, MD       hydrALAZINE  (APRESOLINE ) injection 10 mg  10 mg Intravenous Q2H PRN Emmelia Holdsworth F, MD       insulin  aspart (novoLOG ) injection 0-15 Units  0-15 Units Subcutaneous TID WC Noreene Boreman F, MD       insulin  aspart (novoLOG ) injection 0-5 Units  0-5 Units Subcutaneous QHS Sheniece Ruggles F, MD       insulin  aspart (novoLOG ) injection 3 Units  3 Units Subcutaneous TID WC Cheresa Siers F, MD       ketorolac  (TORADOL ) 30 MG/ML injection 30 mg  30 mg Intravenous Q6H PRN Gaylon Melchor F, MD       melatonin tablet 5 mg  5 mg Oral QHS PRN Claris Pech F, MD       morphine  (PF) 2 MG/ML injection 2 mg  2 mg Intravenous Q3H PRN Mayme Profeta F, MD       ondansetron  (ZOFRAN -ODT) disintegrating tablet 4 mg  4 mg Oral Q6H PRN Monique Gift F, MD       Or   ondansetron  (ZOFRAN ) injection 4 mg  4 mg Intravenous Q6H PRN Chidera Dearcos, Laneta FALCON, MD       oxyCODONE  (  Oxy IR/ROXICODONE ) immediate release tablet 5-10 mg  5-10 mg Oral Q4H PRN Ruqaya Strauss F, MD       pantoprazole  (PROTONIX ) injection 40 mg  40 mg Intravenous Q12H Juliet Vasbinder, Laneta F, MD   40 mg at 12/09/23 0805     Review of Systems Full ROS  was asked and was negative except for the information on the HPI  Physical Exam Blood pressure 115/84, pulse 83, temperature 99 F (37.2 C), temperature source Oral, resp. rate 15, height 5' 9 (1.753 m), weight 71.7 kg, last menstrual period 11/24/2023, SpO2 100%. CONSTITUTIONAL: NAD. EYES: Pupils are equal, round, Sclera are non-icteric. EARS, NOSE, MOUTH AND THROAT: The oropharynx is clear. The oral mucosa is pink and moist. Hearing is intact to voice. LYMPH NODES:  Lymph nodes in the neck are normal. RESPIRATORY:  Lungs are clear. There is normal respiratory effort, with equal breath sounds bilaterally, and without pathologic use of accessory muscles. CARDIOVASCULAR: Heart is regular without murmurs, gallops, or rubs. GI: The abdomen is  soft, tender  chronically incarcerated ventral hernia,m w/o peritonitis.There are no palpable masses. There is no hepatosplenomegaly. There are normal bowel sounds in all quadrants. GU: Rectal deferred.   MUSCULOSKELETAL: Normal muscle strength and tone. No cyanosis or edema.   SKIN: Turgor is good and there are no pathologic skin lesions or ulcers. NEUROLOGIC: Motor and sensation is grossly normal. Cranial nerves are grossly intact. PSYCH:  Oriented to person, place and time. Affect is normal.  Data Reviewed I have personally reviewed the patient's imaging, laboratory findings and medical records.    Assessment/Plan Incarcerated hernia w resolved bowel obstruction due to recurrent symptoms and crescendo symptoms definitely recommend repair/. The risks, benefits, complications, treatment options, and expected outcomes were discussed with the patient. The possibilities of bleeding, recurrent infection, , perforation of viscus organs, damage to surrounding structures,abscess formation, needing a drain placed, the need for additional procedures, reaction to medication, pulmonary aspiration,  failure to diagnose a condition,, and creating a complication requiring transfusion or operation were discussed with the patient. The patient and/or family concurred with the proposed plan, giving informed consent.  I personally spent a total of 75 minutes in the care of the patient today including performing a medically appropriate exam/evaluation, counseling and educating, placing orders, referring and communicating with other health care professionals, documenting clinical information in the EHR, independently interpreting and reviewing images studies and coordinating care.    Laneta Luna, MD FACS General Surgeon 12/09/2023, 9:24 AM

## 2023-12-09 NOTE — Transfer of Care (Signed)
 Immediate Anesthesia Transfer of Care Note  Patient: Victoria Kerr  Procedure(s) Performed: REPAIR, HERNIA, VENTRAL (Abdomen)  Patient Location: PACU  Anesthesia Type:General  Level of Consciousness: awake, alert , and oriented  Airway & Oxygen Therapy: Patient Spontanous Breathing  Post-op Assessment: Report given to RN and Post -op Vital signs reviewed and stable  Post vital signs: Reviewed and stable  Last Vitals:  Vitals Value Taken Time  BP 119/90 12/09/23 13:33  Temp 36.7 C 12/09/23 13:33  Pulse 109 12/09/23 13:35  Resp 23 12/09/23 13:35  SpO2 99 % 12/09/23 13:35  Vitals shown include unfiled device data.  Last Pain:  Vitals:   12/09/23 1333  TempSrc:   PainSc: Asleep         Complications: No notable events documented.

## 2023-12-09 NOTE — Anesthesia Preprocedure Evaluation (Signed)
 Anesthesia Evaluation  Patient identified by MRN, date of birth, ID band Patient awake    Reviewed: Allergy & Precautions, H&P , NPO status , Patient's Chart, lab work & pertinent test results, reviewed documented beta blocker date and time   Airway Mallampati: II  TM Distance: >3 FB Neck ROM: full    Dental  (+) Teeth Intact   Pulmonary neg pulmonary ROS   Pulmonary exam normal        Cardiovascular Exercise Tolerance: Good hypertension, On Medications Normal cardiovascular exam Rhythm:regular Rate:Normal     Neuro/Psych  Headaches  Anxiety      negative psych ROS   GI/Hepatic negative GI ROS, Neg liver ROS,,,  Endo/Other  negative endocrine ROS    Renal/GU negative Renal ROS  negative genitourinary   Musculoskeletal   Abdominal   Peds  Hematology negative hematology ROS (+)   Anesthesia Other Findings Past Medical History: No date: Headache     Comment:  MIGRAINES 2013: PCOS (polycystic ovarian syndrome) Past Surgical History: No date: NO PAST SURGERIES BMI    Body Mass Index: 23.33 kg/m     Reproductive/Obstetrics negative OB ROS                              Anesthesia Physical Anesthesia Plan  ASA: 2 and emergent  Anesthesia Plan: General ETT   Post-op Pain Management:    Induction:   PONV Risk Score and Plan: 4 or greater  Airway Management Planned:   Additional Equipment:   Intra-op Plan:   Post-operative Plan:   Informed Consent: I have reviewed the patients History and Physical, chart, labs and discussed the procedure including the risks, benefits and alternatives for the proposed anesthesia with the patient or authorized representative who has indicated his/her understanding and acceptance.     Dental Advisory Given  Plan Discussed with: CRNA  Anesthesia Plan Comments:         Anesthesia Quick Evaluation

## 2023-12-09 NOTE — Progress Notes (Signed)
 Patient declined CBG checks. She said she is not diabetic and that she is pre-diabetic. Dr Jordis discontinued CBG's

## 2023-12-10 ENCOUNTER — Encounter: Payer: Self-pay | Admitting: Surgery

## 2023-12-10 NOTE — Discharge Summary (Signed)
  Patient ID: Carizma Dunsworth MRN: [id number] DOB/AGE: 34/09/1990 32 y.o.  Admit date: 12/08/2023 Discharge date: 12/09/23   Discharge Diagnoses:  Principal Problem:   Incarcerated ventral hernia   Procedures:Repair of ventral hernia  Hospital Course:  33 yo admitted with findings consistent with ventral hernia incarceration w partial SBO reduced at bedside, she  was taken promptly to the operating room for an uneventful Open repair of ventral hernia. At  The time of discharge the patient was ambulating,  pain was controlled.  Her vital signs were stable and she was afebrile.   physical exam at discharge showed a pt  in no acute distress.  Awake and alert.  Abdomen: Soft incisions healing well without infection or peritonitis.  Extremities well-perfused and no edema.  Condition of the patient the time of discharge was stable  Disposition: Discharge disposition: 01-Home or Self Care       Discharge Instructions     Call MD for:  difficulty breathing, headache or visual disturbances   Complete by: As directed    Call MD for:  extreme fatigue   Complete by: As directed    Call MD for:  hives   Complete by: As directed    Call MD for:  persistant dizziness or light-headedness   Complete by: As directed    Call MD for:  persistant nausea and vomiting   Complete by: As directed    Call MD for:  redness, tenderness, or signs of infection (pain, swelling, redness, odor or green/yellow discharge around incision site)   Complete by: As directed    Call MD for:  severe uncontrolled pain   Complete by: As directed    Call MD for:  temperature >100.4   Complete by: As directed    Diet - low sodium heart healthy   Complete by: As directed    Discharge instructions   Complete by: As directed    Shower 48 hrs   Increase activity slowly   Complete by: As directed    Lifting restrictions   Complete by: As directed    20 lbs x 6 wks      Allergies as of 12/09/2023        Reactions   Penicillins Hives, Rash   Unknown childhood reaction.    Wound Dressing Adhesive Rash   bandaid adhesives        Medication List     STOP taking these medications    cholestyramine 4 g packet Commonly known as: QUESTRAN       TAKE these medications    HYDROcodone -acetaminophen  5-325 MG tablet Commonly known as: NORCO/VICODIN Take 1-2 tablets by mouth every 6 (six) hours as needed for moderate pain (pain score 4-6).   lisinopril  20 MG tablet Commonly known as: ZESTRIL  Take 20 mg by mouth daily.   metFORMIN  500 MG 24 hr tablet Commonly known as: GLUCOPHAGE -XR Take 1,000 mg by mouth 2 (two) times daily with a meal.   Zepbound  12.5 MG/0.5ML Pen Generic drug: tirzepatide  Inject 12.5 mg into the skin once a week.        Follow-up Information     Gilman, [place], MD Follow up on 12/24/2023.   Specialty: General Surgery Contact information: [address] [place] [address] [phone]                  Laneta Luna, MD FACS

## 2023-12-11 LAB — SURGICAL PATHOLOGY

## 2023-12-12 NOTE — Anesthesia Postprocedure Evaluation (Signed)
 Anesthesia Post Note  Patient: Brelee Renk Simkin  Procedure(s) Performed: REPAIR, HERNIA, VENTRAL (Abdomen)  Patient location during evaluation: PACU Anesthesia Type: General Level of consciousness: awake and alert Pain management: pain level controlled Vital Signs Assessment: post-procedure vital signs reviewed and stable Respiratory status: spontaneous breathing, nonlabored ventilation, respiratory function stable and patient connected to nasal cannula oxygen Cardiovascular status: blood pressure returned to baseline and stable Postop Assessment: no apparent nausea or vomiting Anesthetic complications: no   No notable events documented.   Last Vitals:  Vitals:   12/09/23 1620 12/09/23 1640  BP: (!) 131/100 (!) 133/99  Pulse: 92 84  Resp: 16 17  Temp: 37.1 C 37.1 C  SpO2: 100% 100%    Last Pain:  Vitals:   12/09/23 1750  TempSrc:   PainSc: 3                  Lynwood KANDICE Clause

## 2023-12-24 ENCOUNTER — Ambulatory Visit (INDEPENDENT_AMBULATORY_CARE_PROVIDER_SITE_OTHER): Admitting: Surgery

## 2023-12-24 ENCOUNTER — Encounter: Payer: Self-pay | Admitting: Surgery

## 2023-12-24 VITALS — BP 130/95 | HR 77 | Temp 97.7°F | Ht 69.0 in | Wt 155.0 lb

## 2023-12-24 DIAGNOSIS — Z09 Encounter for follow-up examination after completed treatment for conditions other than malignant neoplasm: Secondary | ICD-10-CM | POA: Diagnosis not present

## 2023-12-24 DIAGNOSIS — K436 Other and unspecified ventral hernia with obstruction, without gangrene: Secondary | ICD-10-CM | POA: Diagnosis not present

## 2023-12-24 NOTE — Progress Notes (Signed)
 Outpatient Surgical Follow Up  12/24/2023  Victoria Kerr is an 33 y.o. female.   Chief Complaint  Patient presents with   Routine Post Op    Ventral Hernia Repair     HPI: Victoria Kerr is a 33 yo female well known for ventral hernia repair w mesh 2 1/2 weeks ago. She has done very well, no pain, no fevers, tolerating diet. Her main complaint today is diarrhea after hx of cholecystectomy 3 years ago. She has not identified specific triggers.  Past Medical History:  Diagnosis Date   Headache    MIGRAINES   PCOS (polycystic ovarian syndrome) 2013    Past Surgical History:  Procedure Laterality Date   NO PAST SURGERIES     VENTRAL HERNIA REPAIR N/A 12/09/2023   Procedure: REPAIR, HERNIA, VENTRAL;  Surgeon: Jordis Laneta FALCON, MD;  Location: [facility] ORS;  Service: General;  Laterality: N/A;    Family History  Problem Relation Age of Onset   Heart attack Mother     Social History:  reports that she has never smoked. She has never used smokeless tobacco. She reports current alcohol use. She reports that she does not use drugs.  Allergies:  Allergies  Allergen Reactions   Penicillins Hives and Rash    Unknown childhood reaction.    Wound Dressing Adhesive Rash    bandaid adhesives    Medications reviewed.    ROS Full ROS performed and is otherwise negative other than what is stated in HPI   BP (!) 130/95   Pulse 77   Temp 97.7 F (36.5 C) (Oral)   Ht 5' 9 (1.753 m)   Wt 155 lb (70.3 kg)   LMP 11/24/2023   SpO2 100%   BMI 22.89 kg/m   Physical Exam Vitals and nursing note reviewed. Exam conducted with a chaperone present.  Constitutional:      Appearance: Normal appearance.  Pulmonary:     Effort: Pulmonary effort is normal.     Breath sounds: No stridor.  Abdominal:     General: Abdomen is flat. There is no distension.     Palpations: Abdomen is soft. There is no mass.     Tenderness: There is no abdominal tenderness. There is no guarding or rebound.      Hernia: No hernia is present.     Comments: Incision healing well w/o infection  Musculoskeletal:     Cervical back: Normal range of motion and neck supple.  Skin:    General: Skin is warm and dry.     Capillary Refill: Capillary refill takes less than 2 seconds.  Neurological:     General: No focal deficit present.     Mental Status: She is alert and oriented to person, place, and time.  Psychiatric:        Mood and Affect: Mood normal.        Behavior: Behavior normal.        Thought Content: Thought content normal.        Judgment: Judgment normal.       Assessment/Plan: Doing well after hernia repair.  No evidence of complications  Post cholecystectomy diarrhea, tried cholestyramine and developed constipation, advice about imodium before meals.  May call back if diarrhea persists  I personally spent a total of 20 minutes in the care of the patient today including performing a medically appropriate exam/evaluation, counseling and educating, placing orders, referring and communicating with other health care professionals, documenting clinical information in the EHR, independently interpreting and  reviewing images studies and coordinating care.   Laneta Luna, MD [facility] General Surgeon

## 2023-12-24 NOTE — Patient Instructions (Signed)
